# Patient Record
Sex: Male | Born: 1945 | Race: White | Hispanic: No | Marital: Single | Smoking: Never smoker
Health system: Southern US, Community
[De-identification: ages and names within clinical notes are randomized; demographics above are authoritative.]

## PROBLEM LIST (undated history)

## (undated) DIAGNOSIS — I10 Essential (primary) hypertension: Secondary | ICD-10-CM

## (undated) DIAGNOSIS — E119 Type 2 diabetes mellitus without complications: Secondary | ICD-10-CM

---

## 2014-11-01 ENCOUNTER — Encounter: Payer: Self-pay | Admitting: Hematology & Oncology

## 2014-11-11 ENCOUNTER — Telehealth: Payer: Self-pay | Admitting: Hematology & Oncology

## 2014-11-11 NOTE — Telephone Encounter (Signed)
Left vm w NEW PATIENT today to remind them of their appointment with Dr. Ennever. Also, advised them to bring all medication bottles and insurance card information. ° °

## 2014-11-14 ENCOUNTER — Ambulatory Visit: Payer: Medicare Other

## 2014-11-14 ENCOUNTER — Ambulatory Visit (HOSPITAL_BASED_OUTPATIENT_CLINIC_OR_DEPARTMENT_OTHER): Payer: Medicare Other | Admitting: Lab

## 2014-11-14 ENCOUNTER — Encounter: Payer: Self-pay | Admitting: Family

## 2014-11-14 ENCOUNTER — Ambulatory Visit (HOSPITAL_BASED_OUTPATIENT_CLINIC_OR_DEPARTMENT_OTHER): Payer: Medicare Other | Admitting: Family

## 2014-11-14 VITALS — BP 112/65 | HR 90 | Temp 97.7°F | Resp 18 | Ht 71.0 in | Wt 254.0 lb

## 2014-11-14 DIAGNOSIS — D7589 Other specified diseases of blood and blood-forming organs: Secondary | ICD-10-CM

## 2014-11-14 DIAGNOSIS — D472 Monoclonal gammopathy: Secondary | ICD-10-CM

## 2014-11-14 DIAGNOSIS — D61818 Other pancytopenia: Secondary | ICD-10-CM

## 2014-11-14 DIAGNOSIS — R161 Splenomegaly, not elsewhere classified: Secondary | ICD-10-CM

## 2014-11-14 DIAGNOSIS — E119 Type 2 diabetes mellitus without complications: Secondary | ICD-10-CM

## 2014-11-14 DIAGNOSIS — D500 Iron deficiency anemia secondary to blood loss (chronic): Secondary | ICD-10-CM

## 2014-11-14 LAB — CBC WITH DIFFERENTIAL (CANCER CENTER ONLY)
BASO#: 0 10*3/uL (ref 0.0–0.2)
BASO%: 0.3 % (ref 0.0–2.0)
EOS%: 3.8 % (ref 0.0–7.0)
Eosinophils Absolute: 0.2 10*3/uL (ref 0.0–0.5)
HCT: 38 % — ABNORMAL LOW (ref 38.7–49.9)
HGB: 13.1 g/dL (ref 13.0–17.1)
LYMPH#: 1.6 10*3/uL (ref 0.9–3.3)
LYMPH%: 39 % (ref 14.0–48.0)
MCH: 31.8 pg (ref 28.0–33.4)
MCHC: 34.5 g/dL (ref 32.0–35.9)
MCV: 92 fL (ref 82–98)
MONO#: 0.3 10*3/uL (ref 0.1–0.9)
MONO%: 6.8 % (ref 0.0–13.0)
NEUT%: 50.1 % (ref 40.0–80.0)
NEUTROS ABS: 2 10*3/uL (ref 1.5–6.5)
Platelets: 106 10*3/uL — ABNORMAL LOW (ref 145–400)
RBC: 4.12 10*6/uL — AB (ref 4.20–5.70)
RDW: 16.4 % — ABNORMAL HIGH (ref 11.1–15.7)
WBC: 4 10*3/uL (ref 4.0–10.0)

## 2014-11-14 LAB — CHCC SATELLITE - SMEAR

## 2014-11-14 NOTE — Progress Notes (Signed)
Hematology/Oncology Consultation   Name: Fernando Soto      MRN: [id number]    Location: Room/bed info not found  Date: 11/14/2014 Time:3:42 PM   REFERRING PHYSICIAN:  Nolene Bernheim. Soto   REASON FOR CONSULT:  Pancytopenia, splenomegaly, macrocytosis, monoclonal gammopathy    DIAGNOSIS:  Thrombocytopenia and splenomegaly  HISTORY OF PRESENT ILLNESS: Fernando Soto is a very pleasant 69 yo male with a recent diagnosis of pancytopenia, splenomegaly, macrocytosis, monoclonal gammopathy. He has occasional left sided pain.  He has had no problem with infections. He denies fever, chills, n/v, cough, rash, headache, dizziness, SOB, chest pain, palpitations, diarrhea, blood in urine or stool.  He has had some constipation. He was supposed to have a colonoscopy and he was not clear enough so he is in the process of rescheduling.   He does not smoke or drink alcohol.  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 He has had several toes removed, one recently. His left foot and ankle are bandaged. He has this changed every week at a wound clinic. He has chronic swelling in both legs that comes and goes. Elevation is helpful.  He has no history of bleeding or clotting. No personal or family history of cancer. His appetite is good and he is staying hydrated. His weight is stable at 254 lbs.  He is from the [facility] area and worked for year as a Fernando Soto. He then worked as an Fernando Soto for 15 years before retiring.    ROS: All other 10 point review of systems is negative.   PAST MEDICAL HISTORY:   No past medical history on file.  ALLERGIES: Allergies  Allergen Reactions  . Influenza Vaccines     BP dropped and felt very hot.      MEDICATIONS:  No current outpatient prescriptions on file prior to visit.   No current facility-administered medications on file prior to visit.     PAST SURGICAL HISTORY No past surgical history on  file.  FAMILY HISTORY: No family history on file.  SOCIAL HISTORY:  reports that he has never smoked. He has never used smokeless tobacco. His alcohol and drug histories are not on file.  PERFORMANCE STATUS: The patient's performance status is 1 - Symptomatic but completely ambulatory  PHYSICAL EXAM: Most Recent Vital Signs: Blood pressure 112/65, pulse 90, temperature 97.7 F (36.5 C), temperature source Oral, resp. rate 18, height _0  (1.803 m), weight 254 lb (115.214 kg). BP 112/65 mmHg  Pulse 90  Temp(Src) 97.7 F (36.5 C) (Oral)  Resp 18  Ht _1  (1.803 m)  Wt 254 lb (115.214 kg)  BMI 35.44 kg/m2  General Appearance:    Alert, cooperative, no distress, appears stated age  Head:    Normocephalic, without obvious abnormality, atraumatic  Eyes:    PERRL, conjunctiva/corneas clear, EOM's intact, fundi    benign, both eyes        Throat:   Lips, mucosa, and tongue normal; teeth and gums normal  Neck:   Supple, symmetrical, trachea midline, no adenopathy;    thyroid:  no enlargement/tenderness/nodules; no carotid   bruit or JVD  Back:     Symmetric, no curvature, ROM normal, no CVA tenderness  Lungs:     Clear to auscultation bilaterally, respirations unlabored  Chest Wall:    No tenderness or deformity   Heart:    Regular rate and rhythm, S1 and S2 normal, no murmur, rub  or gallop     Abdomen:     Soft, non-tender, bowel sounds active all four quadrants,    no masses, no organomegaly        Extremities:   Extremities normal, atraumatic, no cyanosis or edema  Pulses:   2+ and symmetric all extremities  Skin:   Skin color, texture, turgor normal, no rashes or lesions  Lymph nodes:   Cervical, supraclavicular, and axillary nodes normal  Neurologic:   CNII-XII intact, normal strength, sensation and reflexes    throughout   LABORATORY DATA:  Results for orders placed or performed in visit on 11/14/14 (from the past 48 hour(s))  CBC with Differential Fernando Soto  [facility]ellite)     Status: Abnormal   Collection Time: 11/14/14  1:48 PM  Result Value Ref Range   WBC 4.0 4.0 - 10.0 10e3/uL   RBC 4.12 (L) 4.20 - 5.70 10e6/uL   HGB 13.1 13.0 - 17.1 g/dL   HCT 38.0 (L) 38.7 - 49.9 %   MCV 92 82 - 98 fL   MCH 31.8 28.0 - 33.4 pg   MCHC 34.5 32.0 - 35.9 g/dL   RDW 16.4 (H) 11.1 - 15.7 %   Platelets 106 (L) 145 - 400 10e3/uL   NEUT# 2.0 1.5 - 6.5 10e3/uL   LYMPH# 1.6 0.9 - 3.3 10e3/uL   MONO# 0.3 0.1 - 0.9 10e3/uL   Eosinophils Absolute 0.2 0.0 - 0.5 10e3/uL   BASO# 0.0 0.0 - 0.2 10e3/uL   NEUT% 50.1 40.0 - 80.0 %   LYMPH% 39.0 14.0 - 48.0 %   MONO% 6.8 0.0 - 13.0 %   EOS% 3.8 0.0 - 7.0 %   BASO% 0.3 0.0 - 2.0 %  [facility] - Smear     Status: None   Collection Time: 11/14/14  1:48 PM  Result Value Ref Range   Smear Result Smear Available       RADIOGRAPHY: No results found.     PATHOLOGY: None  ASSESSMENT/PLAN: Fernando Soto is a very pleasant 69 yo male with a recent diagnosis of pancytopenia, splenomegaly, macrocytosis, monoclonal gammopathy. He has occasional left flank pain. He has had no problem with infections or bleeding.  His WBC is 4.0, Hgb 13.1, MCV 92 and platelets 106. His smear was reviewed by Fernando Soto. He will not be needing a bone marrow biopsy at this time.  We will see him back in 2 months for labs and follow-up. We will also get another abdominal ultrasound that same day.  All questions were answered. He knows to call the clinic with any problems, questions or concerns. We can certainly see him much sooner if necessary.  The patient was discussed with and also seen by Fernando Soto and he is in agreement with the aforementioned.   [facility] M    Addendum: I saw and examined the patient with Fernando Soto.  We looked at his smear. He had normochromic and normocytic red blood cells. There were no nucleated red blood cells. There were no teardrop cells. He had no schistocytes or spherocytes. White blood cells appeared  normal in morphology maturation. There were no hypersegmented polys. There were no immature myeloid or lymphoid cells. Platelets were slightly decreased in number. He had several large platelets. Platelets were well granulated.  Eyes exam, I wrote cannot palpate his spleen.  It's certainly hard to say what is going on with the splenomegaly. It's possible that he may have early NASH. Is also possible there might be some type of  infiltrative spleen issue. He may have congestive splenomegaly. He may have idiopathic splenomegaly.  I find it very interesting that he has diabetes and has only had diabetes for 4 years but yet has all these complications.  He ultimately may need to have a bone marrow biopsy done. However, I just don't think that doing one now is really going to make a difference. He is asymptomatic.  If there is a monoclonal myopathy, I would suspect that this is related to his underlying diabetes.  We spent about 45 minutes with him. We answered all his questions.  I think we probably should get him back in about 3 months. I would like to do an ultrasound when he gets back.  Laurey Arrow

## 2014-11-15 LAB — IRON AND TIBC CHCC
%SAT: 25 % (ref 20–55)
Iron: 67 ug/dL (ref 42–163)
TIBC: 265 ug/dL (ref 202–409)
UIBC: 198 ug/dL (ref 117–376)

## 2014-11-15 LAB — FERRITIN CHCC: Ferritin: 160 ng/ml (ref 22–316)

## 2014-11-16 LAB — COMPREHENSIVE METABOLIC PANEL
AST: 17 U/L (ref 0–37)
Albumin: 4.4 g/dL (ref 3.5–5.2)
Alkaline Phosphatase: 82 U/L (ref 39–117)
BILIRUBIN TOTAL: 1.1 mg/dL (ref 0.2–1.2)
BUN: 17 mg/dL (ref 6–23)
CHLORIDE: 101 meq/L (ref 96–112)
CO2: 27 meq/L (ref 19–32)
Calcium: 9.6 mg/dL (ref 8.4–10.5)
Creatinine, Ser: 1 mg/dL (ref 0.50–1.35)
GLUCOSE: 207 mg/dL — AB (ref 70–99)
POTASSIUM: 4.6 meq/L (ref 3.5–5.3)
SODIUM: 138 meq/L (ref 135–145)
Total Protein: 6.5 g/dL (ref 6.0–8.3)

## 2014-11-16 LAB — PROTEIN ELECTROPHORESIS, SERUM, WITH REFLEX
ALBUMIN ELP: 63.8 % (ref 55.8–66.1)
Alpha-1-Globulin: 4.5 % (ref 2.9–4.9)
Alpha-2-Globulin: 11.2 % (ref 7.1–11.8)
BETA 2: 5 % (ref 3.2–6.5)
BETA GLOBULIN: 5.8 % (ref 4.7–7.2)
GAMMA GLOBULIN: 9.7 % — AB (ref 11.1–18.8)
M-Spike, %: 0.2 g/dL
Total Protein, Serum Electrophoresis: 6.5 g/dL (ref 6.0–8.3)

## 2014-11-16 LAB — IFE INTERPRETATION

## 2014-11-16 LAB — IGG, IGA, IGM
IGM, SERUM: 40 mg/dL — AB (ref 41–251)
IgA: 195 mg/dL (ref 68–379)
IgG (Immunoglobin G), Serum: 609 mg/dL — ABNORMAL LOW (ref 650–1600)

## 2014-11-16 LAB — HEMOGLOBINOPATHY EVALUATION
HGB F QUANT: 0 % (ref 0.0–2.0)
Hemoglobin Other: 0 %
Hgb A2 Quant: 2.6 % (ref 2.2–3.2)
Hgb A: 97.4 % (ref 96.8–97.8)
Hgb S Quant: 0 %

## 2014-11-16 LAB — LACTATE DEHYDROGENASE: LDH: 180 U/L (ref 94–250)

## 2014-11-16 LAB — RETICULOCYTES (CHCC)
ABS Retic: 141.4 10*3/uL (ref 19.0–186.0)
RBC.: 4.16 MIL/uL — ABNORMAL LOW (ref 4.22–5.81)
RETIC CT PCT: 3.4 % — AB (ref 0.4–2.3)

## 2015-01-11 ENCOUNTER — Encounter: Payer: Self-pay | Admitting: Hematology & Oncology

## 2015-01-11 ENCOUNTER — Ambulatory Visit (HOSPITAL_BASED_OUTPATIENT_CLINIC_OR_DEPARTMENT_OTHER)
Admission: RE | Admit: 2015-01-11 | Discharge: 2015-01-11 | Disposition: A | Discharge status: Home / Self Care | Payer: Medicare Other | Source: Ambulatory Visit | Attending: Family | Admitting: Family

## 2015-01-11 ENCOUNTER — Other Ambulatory Visit (HOSPITAL_BASED_OUTPATIENT_CLINIC_OR_DEPARTMENT_OTHER): Payer: Medicare Other | Admitting: Lab

## 2015-01-11 ENCOUNTER — Ambulatory Visit (HOSPITAL_BASED_OUTPATIENT_CLINIC_OR_DEPARTMENT_OTHER): Payer: Medicare Other | Admitting: Hematology & Oncology

## 2015-01-11 VITALS — BP 136/70 | HR 87 | Temp 97.9°F | Resp 18 | Ht 69.0 in | Wt 259.0 lb

## 2015-01-11 DIAGNOSIS — D696 Thrombocytopenia, unspecified: Secondary | ICD-10-CM | POA: Diagnosis not present

## 2015-01-11 DIAGNOSIS — R161 Splenomegaly, not elsewhere classified: Secondary | ICD-10-CM

## 2015-01-11 DIAGNOSIS — G629 Polyneuropathy, unspecified: Secondary | ICD-10-CM | POA: Diagnosis not present

## 2015-01-11 DIAGNOSIS — E119 Type 2 diabetes mellitus without complications: Secondary | ICD-10-CM

## 2015-01-11 LAB — CBC WITH DIFFERENTIAL (CANCER CENTER ONLY)
BASO#: 0 10*3/uL (ref 0.0–0.2)
BASO%: 0.2 % (ref 0.0–2.0)
EOS%: 2.9 % (ref 0.0–7.0)
Eosinophils Absolute: 0.1 10*3/uL (ref 0.0–0.5)
HCT: 36.2 % — ABNORMAL LOW (ref 38.7–49.9)
HGB: 12.8 g/dL — ABNORMAL LOW (ref 13.0–17.1)
LYMPH#: 1.8 10*3/uL (ref 0.9–3.3)
LYMPH%: 44.9 % (ref 14.0–48.0)
MCH: 32.8 pg (ref 28.0–33.4)
MCHC: 35.4 g/dL (ref 32.0–35.9)
MCV: 93 fL (ref 82–98)
MONO#: 0.3 10*3/uL (ref 0.1–0.9)
MONO%: 8.3 % (ref 0.0–13.0)
NEUT%: 43.7 % (ref 40.0–80.0)
NEUTROS ABS: 1.8 10*3/uL (ref 1.5–6.5)
PLATELETS: 89 10*3/uL — AB (ref 145–400)
RBC: 3.9 10*6/uL — ABNORMAL LOW (ref 4.20–5.70)
RDW: 17.1 % — AB (ref 11.1–15.7)
WBC: 4.1 10*3/uL (ref 4.0–10.0)

## 2015-01-11 LAB — CMP (CANCER CENTER ONLY)
ALT(SGPT): 13 U/L (ref 10–47)
AST: 25 U/L (ref 11–38)
Albumin: 3.8 g/dL (ref 3.3–5.5)
Alkaline Phosphatase: 90 U/L — ABNORMAL HIGH (ref 26–84)
BUN: 20 mg/dL (ref 7–22)
CALCIUM: 9.2 mg/dL (ref 8.0–10.3)
CO2: 29 mEq/L (ref 18–33)
CREATININE: 1.2 mg/dL (ref 0.6–1.2)
Chloride: 104 mEq/L (ref 98–108)
Glucose, Bld: 164 mg/dL — ABNORMAL HIGH (ref 73–118)
Potassium: 4.3 mEq/L (ref 3.3–4.7)
SODIUM: 141 meq/L (ref 128–145)
TOTAL PROTEIN: 6.5 g/dL (ref 6.4–8.1)
Total Bilirubin: 1.4 mg/dl (ref 0.20–1.60)

## 2015-01-11 LAB — LACTATE DEHYDROGENASE: LDH: 172 U/L (ref 94–250)

## 2015-01-11 LAB — CHCC SATELLITE - SMEAR

## 2015-01-13 ENCOUNTER — Telehealth: Payer: Self-pay | Admitting: Hematology & Oncology

## 2015-01-13 NOTE — Telephone Encounter (Signed)
Left pt message to call for details of 4-8 and 4-13 appointments

## 2015-01-13 NOTE — Progress Notes (Signed)
Hematology and Oncology Follow Up Visit  Fernando Soto [id number] 1946/02/05 69 y.o. 01/13/2015   Principle Diagnosis:   Thrombocytopenia  Splenomegaly  Current Therapy:    Observation     Interim History:  Mr. Kestenbaum is back for second office visit. We first saw him back in January.  He still has problem with neuropathy. He has bad diabetes. He did have a ultrasound done today of the abdomen. This showed mild to moderate splenomegaly.  He's had no bleeding. He's had no bruising. He's had no change in medications.  We first saw him, our workup was pretty much unremarkable. He had a tiny monoclonal spike of 0.2 g/L. His IgG level was a little bit low as 609 mg/dL. He had a normal ferritin and iron saturation. His blood sugars were quite high.  I suspect that he probably has cirrhosis. He probably has NASH. I think a CT scan is going to be necessary. Medications:  Current outpatient prescriptions:  .  alfuzosin (UROXATRAL) 10 MG 24 hr tablet, Take 10 mg by mouth., Disp: , Rfl:  .  allopurinol (ZYLOPRIM) 300 MG tablet, Take 300 mg by mouth daily. Frequency:   Dosage:0.0     Instructions:  Note:, Disp: , Rfl:  .  Cholecalciferol (D 2000) 2000 UNITS TABS, Take by mouth every morning. , Disp: , Rfl:  .  clindamycin (CLEOCIN) 300 MG capsule, Take 300 mg by mouth 3 (three) times daily. , Disp: , Rfl:  .  doxepin (SINEQUAN) 50 MG capsule, Take 50 mg by mouth at bedtime as needed. Frequency:   Dosage:0.0     Instructions:  Note:, Disp: , Rfl:  .  enalapril (VASOTEC) 10 MG tablet, Take 10 mg by mouth 2 (two) times daily. Frequency:   Dosage:0.0     Instructions:  Note:, Disp: , Rfl:  .  FOLBIC 2.5-25-2 MG TABS, Take 1 tablet by mouth daily., Disp: , Rfl: 99 .  Folic Acid-Vit N1-AFB X03 (FOLBEE) 2.5-25-1 MG TABS tablet, Take 1 tablet by mouth daily. Frequency:   Dosage:0.0     Instructions:  Note:, Disp: , Rfl:  .  gabapentin (NEURONTIN) 300 MG capsule, Take 300 mg by mouth 3 (three)  times daily. , Disp: , Rfl:  .  gabapentin (NEURONTIN) 300 MG capsule, Take 300 mg by mouth 3 (three) times daily. , Disp: , Rfl: 99 .  gemfibrozil (LOPID) 600 MG tablet, Take 600 mg by mouth 2 (two) times daily before a meal. , Disp: , Rfl:  .  insulin detemir (LEVEMIR) 100 UNIT/ML injection, Inject 35 Units into the skin at bedtime. Inject unit subcutaneous as directed, Disp: , Rfl:  .  insulin NPH-regular Human (HUMULIN 70/30) (70-30) 100 UNIT/ML injection, Inject 25 Units into the skin daily with breakfast. Frequency:   Dosage:0.0     Instructions:  Note:, Disp: , Rfl:  .  L-Methylfolate-B6-B12 (FOLTX) 1.13-25-2 MG TABS, Take by mouth., Disp: , Rfl:  .  metoprolol tartrate (LOPRESSOR) 25 MG tablet, Take 12.5 mg by mouth 2 (two) times daily. , Disp: , Rfl:  .  Multiple Vitamin (MULTIVITAMIN) capsule, Take by mouth daily. , Disp: , Rfl:  .  Omega-3 1000 MG CAPS, Take by mouth every morning. , Disp: , Rfl:  .  omeprazole (PRILOSEC) 40 MG capsule, Frequency:   Dosage:0.0     Instructions:  Note:, Disp: , Rfl:  .  rivaroxaban (XARELTO) 20 MG TABS tablet, Take by mouth daily with supper. PT STATES HE TAKES .5 MG DAILY, Disp: ,  Rfl:  .  temazepam (RESTORIL) 30 MG capsule, Take 30 mg by mouth at bedtime as needed. Frequency:   Dosage:0.0     Instructions:  Note:, Disp: , Rfl:  .  traMADol (ULTRAM) 50 MG tablet, Take 50 mg by mouth every 6 (six) hours as needed. for pain, Disp: , Rfl: 0 .  HYDROcodone-acetaminophen (NORCO) 10-325 MG per tablet, Take 1 tablet by mouth every 6 (six) hours as needed. , Disp: , Rfl:   Allergies:  Allergies  Allergen Reactions  . Influenza Vaccines     BP dropped and felt very hot.    Past Medical History, Surgical history, Social history, and Family History were reviewed and updated.  Review of Systems: As above  Physical Exam:  height is $RemoveB'5\' 9"'McjJnBlo$  (1.753 m) and weight is 259 lb (117.482 kg). His oral temperature is 97.9 F (36.6 C). His blood pressure is 136/70 and  his pulse is 87. His respiration is 18.   Wt Readings from Last 3 Encounters:  01/11/15 259 lb (117.482 kg)  11/14/14 254 lb (115.214 kg)     Somewhat obese white gentleman in no obvious distress. Head and neck exam shows no ocular or oral lesions. He has no palpable cervical or supraclavicular lymph nodes. Lungs are clear. Cardiac exam regular rate and rhythm with no murmurs, rubs or bruits. Abdomen is obese but soft. He has good bowel sounds. There is no fluid wave. There is no palpable liver or spleen tip. Back exam shows no tenderness over the spine, ribs or hips. Extremities shows no clubbing, cyanosis or edema. He does have some chronic nonpitting edema of the lower legs. He has some stasis dermatitis changes in the lower legs. Neurological exam shows some hypersensitivity to touch in the feet and ankles.  Lab Results  Component Value Date   WBC 4.1 01/11/2015   HGB 12.8* 01/11/2015   HCT 36.2* 01/11/2015   MCV 93 01/11/2015   PLT 89* 01/11/2015     Chemistry      Component Value Date/Time   NA 141 01/11/2015 0845   NA 138 11/14/2014 1348   K 4.3 01/11/2015 0845   K 4.6 11/14/2014 1348   CL 104 01/11/2015 0845   CL 101 11/14/2014 1348   CO2 29 01/11/2015 0845   CO2 27 11/14/2014 1348   BUN 20 01/11/2015 0845   BUN 17 11/14/2014 1348   CREATININE 1.2 01/11/2015 0845   CREATININE 1.00 11/14/2014 1348      Component Value Date/Time   CALCIUM 9.2 01/11/2015 0845   CALCIUM 9.6 11/14/2014 1348   ALKPHOS 90* 01/11/2015 0845   ALKPHOS 82 11/14/2014 1348   AST 25 01/11/2015 0845   AST 17 11/14/2014 1348   ALT 13 01/11/2015 0845   ALT <8 11/14/2014 1348   BILITOT 1.40 01/11/2015 0845   BILITOT 1.1 11/14/2014 1348         Impression and Plan: Mr. Cure is 69 year old gentleman. He has splenomegaly. He has thrombocytopenia.  Again, I have to suspect that he is going to have cirrhosis. A CT scan can help [place] with this.  I would still like to hold off on a bone  marrow biopsy on him. However, if his platelet count continues to drop, I don't think we have any other option but to do a bone marrow biopsy.  I spent about 30 minutes with him. I went over his labs. I explained to him what I thought was going on and why we had to  do a CT scan. He understands this and agrees.  I want to get him back in about 4 weeks or so.   Volanda Napoleon, MD 3/11/20161:49 PM

## 2015-02-02 ENCOUNTER — Telehealth: Payer: Self-pay | Admitting: Hematology & Oncology

## 2015-02-02 NOTE — Telephone Encounter (Signed)
Pt called got CT last week for kidney stones has disc. I transferred to RN line so they could check and see if he needed the one scheduled for 4-8

## 2015-02-09 ENCOUNTER — Telehealth: Payer: Self-pay | Admitting: Hematology & Oncology

## 2015-02-09 NOTE — Telephone Encounter (Signed)
Pilar Plate left message wanting to cx CT. I called him back and the CT was for next week, he said he moved it this morning. He finally said he wanted to move the MD appointment also because pt was in the hospital. That has been moved to 4-22

## 2015-02-10 ENCOUNTER — Ambulatory Visit (HOSPITAL_BASED_OUTPATIENT_CLINIC_OR_DEPARTMENT_OTHER): Payer: Medicare Other

## 2015-02-15 ENCOUNTER — Telehealth: Payer: Self-pay | Admitting: Hematology & Oncology

## 2015-02-15 ENCOUNTER — Other Ambulatory Visit: Payer: Medicare Other

## 2015-02-15 ENCOUNTER — Ambulatory Visit: Payer: Medicare Other | Admitting: Hematology & Oncology

## 2015-02-15 NOTE — Telephone Encounter (Signed)
PT CALLED HAD QUESTION ABOT ct AND INSURANCE, i TRANSFERRED TO tARA

## 2015-02-17 ENCOUNTER — Ambulatory Visit (HOSPITAL_BASED_OUTPATIENT_CLINIC_OR_DEPARTMENT_OTHER)
Admission: RE | Admit: 2015-02-17 | Discharge: 2015-02-17 | Disposition: A | Discharge status: Home / Self Care | Payer: Medicare Other | Source: Ambulatory Visit | Attending: Hematology & Oncology | Admitting: Hematology & Oncology

## 2015-02-17 DIAGNOSIS — R161 Splenomegaly, not elsewhere classified: Secondary | ICD-10-CM | POA: Insufficient documentation

## 2015-02-17 DIAGNOSIS — I700 Atherosclerosis of aorta: Secondary | ICD-10-CM | POA: Insufficient documentation

## 2015-02-17 DIAGNOSIS — N200 Calculus of kidney: Secondary | ICD-10-CM | POA: Diagnosis not present

## 2015-02-17 DIAGNOSIS — D696 Thrombocytopenia, unspecified: Secondary | ICD-10-CM | POA: Insufficient documentation

## 2015-02-17 DIAGNOSIS — K7460 Unspecified cirrhosis of liver: Secondary | ICD-10-CM | POA: Insufficient documentation

## 2015-02-17 MED ORDER — IOHEXOL 300 MG/ML  SOLN
100.0000 mL | Freq: Once | INTRAMUSCULAR | Status: AC | PRN
  Administered 2015-02-17: 100 mL via INTRAVENOUS

## 2015-02-20 ENCOUNTER — Telehealth: Payer: Self-pay | Admitting: *Deleted

## 2015-02-20 ENCOUNTER — Telehealth: Payer: Self-pay | Admitting: Emergency Medicine

## 2015-02-20 NOTE — Telephone Encounter (Addendum)
Patient aware of results.  ----- Message from Volanda Napoleon, MD sent at 02/20/2015  7:17 AM EDT ----- Call - everything looks stable!! Spleen is not any larger.  pete

## 2015-02-20 NOTE — Telephone Encounter (Signed)
Patient called to inquire if he needed to keep his appointment on 4/22 for labs and Dr Marin Olp; patient instructed to keep appointment so that he and Dr Marin Olp can further discuss treatment plan. Patient verbalized understanding.

## 2015-02-24 ENCOUNTER — Encounter: Payer: Self-pay | Admitting: Hematology & Oncology

## 2015-02-24 ENCOUNTER — Ambulatory Visit (HOSPITAL_BASED_OUTPATIENT_CLINIC_OR_DEPARTMENT_OTHER): Payer: Medicare Other | Admitting: Hematology & Oncology

## 2015-02-24 ENCOUNTER — Ambulatory Visit (HOSPITAL_BASED_OUTPATIENT_CLINIC_OR_DEPARTMENT_OTHER): Payer: Medicare Other

## 2015-02-24 VITALS — BP 119/67 | HR 79 | Temp 97.4°F | Resp 18 | Ht 69.0 in | Wt 249.0 lb

## 2015-02-24 DIAGNOSIS — D696 Thrombocytopenia, unspecified: Secondary | ICD-10-CM | POA: Diagnosis not present

## 2015-02-24 DIAGNOSIS — R161 Splenomegaly, not elsewhere classified: Secondary | ICD-10-CM | POA: Diagnosis present

## 2015-02-24 LAB — CBC WITH DIFFERENTIAL (CANCER CENTER ONLY)
BASO#: 0 10*3/uL (ref 0.0–0.2)
BASO%: 0.2 % (ref 0.0–2.0)
EOS%: 2.1 % (ref 0.0–7.0)
Eosinophils Absolute: 0.1 10*3/uL (ref 0.0–0.5)
HCT: 38 % — ABNORMAL LOW (ref 38.7–49.9)
HGB: 13.6 g/dL (ref 13.0–17.1)
LYMPH#: 1.3 10*3/uL (ref 0.9–3.3)
LYMPH%: 30.3 % (ref 14.0–48.0)
MCH: 33.2 pg (ref 28.0–33.4)
MCHC: 35.8 g/dL (ref 32.0–35.9)
MCV: 93 fL (ref 82–98)
MONO#: 0.3 10*3/uL (ref 0.1–0.9)
MONO%: 5.9 % (ref 0.0–13.0)
NEUT#: 2.7 10*3/uL (ref 1.5–6.5)
NEUT%: 61.5 % (ref 40.0–80.0)
Platelets: 90 10*3/uL — ABNORMAL LOW (ref 145–400)
RBC: 4.1 10*6/uL — ABNORMAL LOW (ref 4.20–5.70)
RDW: 14.4 % (ref 11.1–15.7)
WBC: 4.4 10*3/uL (ref 4.0–10.0)

## 2015-02-24 LAB — LACTATE DEHYDROGENASE: LDH: 168 U/L (ref 94–250)

## 2015-02-24 NOTE — Progress Notes (Signed)
Hematology and Oncology Follow Up Visit  Fernando Soto [id number] June 04, 1946 69 y.o. 02/24/2015   Principle Diagnosis:   Thrombocytopenia  Splenomegaly  Current Therapy:    Observation     Interim History:  Fernando Soto is back for follow-up. 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  We did go ahead and have a CT scan done of his abdomen. He has moderate splenomegaly. There is no cirrhosis. There is no adenopathy.  He's lost some more weight. His blood sugars have been on the high side.  He's had no issues with alcohol use. He does not smoke.  He's had no fever. He's had no bleeding.   He has had some more pain in the left upper quadrant of his abdomen. I suspect this might be from his spleen.   Medications:  Current outpatient prescriptions:  .  allopurinol (ZYLOPRIM) 300 MG tablet, Take 300 mg by mouth daily. Frequency:   Dosage:0.0     Instructions:  Note:, Disp: , Rfl:  .  Cholecalciferol (D 2000) 2000 UNITS TABS, Take by mouth every morning. , Disp: , Rfl:  .  doxepin (SINEQUAN) 50 MG capsule, Take 50 mg by mouth at bedtime as needed. Frequency:   Dosage:0.0     Instructions:  Note:, Disp: , Rfl:  .  enalapril (VASOTEC) 10 MG tablet, Take 10 mg by mouth 2 (two) times daily. Frequency:   Dosage:0.0     Instructions:  Note:, Disp: , Rfl:  .  FOLBIC 2.5-25-2 MG TABS, Take 1 tablet by mouth daily., Disp: , Rfl: 99 .  Folic Acid-Vit H8-ION G29 (FOLBEE) 2.5-25-1 MG TABS tablet, Take 1 tablet by mouth daily. Frequency:   Dosage:0.0     Instructions:  Note:, Disp: , Rfl:  .  gabapentin (NEURONTIN) 300 MG capsule, Take 300 mg by mouth 3 (three) times daily. , Disp: , Rfl:  .  gemfibrozil (LOPID) 600 MG tablet, Take 600 mg by mouth 2 (two) times daily before a meal. , Disp: , Rfl:  .  insulin detemir (LEVEMIR) 100 UNIT/ML injection, Inject 35 Units into the skin at bedtime. Inject unit subcutaneous as directed, Disp: , Rfl:    .  insulin NPH-regular Human (HUMULIN 70/30) (70-30) 100 UNIT/ML injection, Inject 25 Units into the skin daily with breakfast. Frequency:   Dosage:0.0     Instructions:  Note:, Disp: , Rfl:  .  L-Methylfolate-B6-B12 (FOLTX) 1.13-25-2 MG TABS, Take by mouth every morning. , Disp: , Rfl:  .  metoprolol tartrate (LOPRESSOR) 25 MG tablet, Take 12.5 mg by mouth 2 (two) times daily. , Disp: , Rfl:  .  Multiple Vitamin (MULTIVITAMIN) capsule, Take by mouth daily. , Disp: , Rfl:  .  Omega-3 1000 MG CAPS, Take by mouth every morning. , Disp: , Rfl:  .  rivaroxaban (XARELTO) 20 MG TABS tablet, Take by mouth daily with supper. PT STATES HE TAKES .5 MG DAILY, Disp: , Rfl:  .  temazepam (RESTORIL) 30 MG capsule, Take 30 mg by mouth at bedtime as needed. Frequency:   Dosage:0.0     Instructions:  Note:, Disp: , Rfl:  .  traMADol (ULTRAM) 50 MG tablet, Take 50 mg by mouth every 6 (six) hours as needed. for pain, Disp: , Rfl: 0 .  alfuzosin (UROXATRAL) 10 MG 24 hr tablet, Take 10 mg by mouth., Disp: , Rfl:  .  HYDROcodone-acetaminophen (NORCO) 10-325 MG per tablet, Take 1 tablet by mouth every 6 (six) hours  as needed. , Disp: , Rfl:  .  omeprazole (PRILOSEC) 40 MG capsule, Frequency:   Dosage:0.0     Instructions:  Note:, Disp: , Rfl:   Allergies:  Allergies  Allergen Reactions  . Influenza Vaccines     BP dropped and felt very hot.    Past Medical History, Surgical history, Social history, and Family History were reviewed and updated.  Review of Systems: As above  Physical Exam:  height is 5\' 9"  (1.753 m) and weight is 249 lb (112.946 kg). His oral temperature is 97.4 F (36.3 C). His blood pressure is 119/67 and his pulse is 79. His respiration is 18.   Wt Readings from Last 3 Encounters:  02/24/15 249 lb (112.946 kg)  01/11/15 259 lb (117.482 kg)  11/14/14 254 lb (115.214 kg)     Somewhat obese white gentleman in no obvious distress. Head and neck exam shows no ocular or oral lesions. He has  no palpable cervical or supraclavicular lymph nodes. Lungs are clear. Cardiac exam regular rate and rhythm with no murmurs, rubs or bruits. Abdomen is obese but soft. He has good bowel sounds. There is no fluid wave. There is no palpable liver edge. His spleen tip might be palpable at the left costal margin.. Back exam shows no tenderness over the spine, ribs or hips. Extremities shows no clubbing, cyanosis or edema. He has a foot splint on his left foot. He does have some chronic nonpitting edema of the lower legs. He has some stasis dermatitis changes in the lower legs. Neurological exam shows some hypersensitivity to touch in the feet and ankles.  Lab Results  Component Value Date   WBC 4.4 02/24/2015   HGB 13.6 02/24/2015   HCT 38.0* 02/24/2015   MCV 93 02/24/2015   PLT 90* 02/24/2015     Chemistry      Component Value Date/Time   NA 141 01/11/2015 0845   NA 138 11/14/2014 1348   K 4.3 01/11/2015 0845   K 4.6 11/14/2014 1348   CL 104 01/11/2015 0845   CL 101 11/14/2014 1348   CO2 29 01/11/2015 0845   CO2 27 11/14/2014 1348   BUN 20 01/11/2015 0845   BUN 17 11/14/2014 1348   CREATININE 1.2 01/11/2015 0845   CREATININE 1.00 11/14/2014 1348      Component Value Date/Time   CALCIUM 9.2 01/11/2015 0845   CALCIUM 9.6 11/14/2014 1348   ALKPHOS 90* 01/11/2015 0845   ALKPHOS 82 11/14/2014 1348   AST 25 01/11/2015 0845   AST 17 11/14/2014 1348   ALT 13 01/11/2015 0845   ALT <8 11/14/2014 1348   BILITOT 1.40 01/11/2015 0845   BILITOT 1.1 11/14/2014 1348         Impression and Plan: Fernando Soto is 69 year old gentleman. He has splenomegaly. He has thrombocytopenia.  He is looking pretty steady. His blood counts are stable. I looked at his blood smear. I do not see anything that looked unusual.  I think we probably get him back in 3 months. I want to do a ultrasound we see him back. This will give me an idea as to his splenomegaly.  It is possible that he may have splenic  lymphoma.  Ultimately, if the pain worsens, he may need to have his spleen taken out. I talked him about this.  I spent about 30 minutes with him.Fernando Napoleon, MD 4/22/201611:23 AM

## 2015-02-28 LAB — COMPREHENSIVE METABOLIC PANEL
ALBUMIN: 4.3 g/dL (ref 3.5–5.2)
ALK PHOS: 85 U/L (ref 39–117)
ALT: <8 U/L (ref 0–53)
AST: 16 U/L (ref 0–37)
BUN: 19 mg/dL (ref 6–23)
CO2: 25 mEq/L (ref 19–32)
Calcium: 9.2 mg/dL (ref 8.4–10.5)
Chloride: 102 mEq/L (ref 96–112)
Creatinine, Ser: 1.02 mg/dL (ref 0.50–1.35)
GLUCOSE: 172 mg/dL — AB (ref 70–99)
POTASSIUM: 4.1 meq/L (ref 3.5–5.3)
Sodium: 137 mEq/L (ref 135–145)
Total Bilirubin: 1.2 mg/dL (ref 0.2–1.2)
Total Protein: 6.3 g/dL (ref 6.0–8.3)

## 2015-02-28 LAB — KAPPA/LAMBDA LIGHT CHAINS
Kappa free light chain: 2.27 mg/dL — ABNORMAL HIGH (ref 0.33–1.94)
Kappa:Lambda Ratio: 1.45 (ref 0.26–1.65)
Lambda Free Lght Chn: 1.57 mg/dL (ref 0.57–2.63)

## 2015-02-28 LAB — LUPUS ANTICOAGULANT PANEL
DRVVT 1:1 Mix: 44.4 secs — ABNORMAL HIGH (ref <42.9)
DRVVT: 58.9 secs — ABNORMAL HIGH (ref <42.9)
Drvvt confirmation: 1.14 Ratio (ref <1.15)
Lupus Anticoagulant: NOT DETECTED
PTT LA: 41.4 s (ref 28.0–43.0)

## 2015-02-28 LAB — PROTEIN ELECTROPHORESIS, SERUM, WITH REFLEX
ALBUMIN ELP: 4.1 g/dL (ref 3.8–4.8)
ALPHA-1-GLOBULIN: 0.3 g/dL (ref 0.2–0.3)
ALPHA-2-GLOBULIN: 0.7 g/dL (ref 0.5–0.9)
Abnormal Protein Band1: 0.2 g/dL
BETA 2: 0.3 g/dL (ref 0.2–0.5)
Beta Globulin: 0.4 g/dL (ref 0.4–0.6)
GAMMA GLOBULIN: 0.5 g/dL — AB (ref 0.8–1.7)
Total Protein, Serum Electrophoresis: 6.3 g/dL (ref 6.1–8.1)

## 2015-02-28 LAB — IGG, IGA, IGM
IgA: 171 mg/dL (ref 68–379)
IgG (Immunoglobin G), Serum: 549 mg/dL — ABNORMAL LOW (ref 650–1600)
IgM, Serum: 32 mg/dL — ABNORMAL LOW (ref 41–251)

## 2015-02-28 LAB — CARDIOLIPIN ANTIBODIES, IGG, IGM, IGA
ANTICARDIOLIPIN IGG: 5 GPL U/mL (ref <23)
ANTICARDIOLIPIN IGM: 1 [MPL'U]/mL (ref <11)
Anticardiolipin IgA: 3 APL U/mL (ref <22)

## 2015-02-28 LAB — IFE INTERPRETATION

## 2015-04-05 ENCOUNTER — Telehealth: Payer: Self-pay | Admitting: *Deleted

## 2015-04-24 NOTE — Telephone Encounter (Signed)
error 

## 2015-05-25 ENCOUNTER — Other Ambulatory Visit: Payer: Self-pay | Admitting: *Deleted

## 2015-05-25 DIAGNOSIS — R161 Splenomegaly, not elsewhere classified: Secondary | ICD-10-CM

## 2015-05-26 ENCOUNTER — Ambulatory Visit (HOSPITAL_BASED_OUTPATIENT_CLINIC_OR_DEPARTMENT_OTHER): Admission: RE | Admit: 2015-05-26 | Payer: Medicare Other | Source: Ambulatory Visit

## 2015-05-26 ENCOUNTER — Other Ambulatory Visit: Payer: Medicare Other

## 2015-05-26 ENCOUNTER — Telehealth: Payer: Self-pay | Admitting: Hematology & Oncology

## 2015-05-26 ENCOUNTER — Other Ambulatory Visit (HOSPITAL_BASED_OUTPATIENT_CLINIC_OR_DEPARTMENT_OTHER): Payer: Medicare Other

## 2015-05-26 ENCOUNTER — Ambulatory Visit: Payer: Medicare Other | Admitting: Hematology & Oncology

## 2015-05-26 NOTE — Telephone Encounter (Signed)
Returned pt called and based on the symptoms describe I encouraged pt to come in to be evaluated but pt repeatedly refused. Requested to be rescheduled for Monday.  Scheduled pt for 7/25 for [place], Labs, Dr. appt.

## 2015-05-29 ENCOUNTER — Ambulatory Visit (HOSPITAL_BASED_OUTPATIENT_CLINIC_OR_DEPARTMENT_OTHER): Payer: Medicare Other | Admitting: Family

## 2015-05-29 ENCOUNTER — Other Ambulatory Visit (HOSPITAL_BASED_OUTPATIENT_CLINIC_OR_DEPARTMENT_OTHER): Payer: Medicare Other

## 2015-05-29 ENCOUNTER — Encounter: Payer: Self-pay | Admitting: Family

## 2015-05-29 ENCOUNTER — Ambulatory Visit (HOSPITAL_BASED_OUTPATIENT_CLINIC_OR_DEPARTMENT_OTHER)
Admission: RE | Admit: 2015-05-29 | Discharge: 2015-05-29 | Disposition: A | Discharge status: Home / Self Care | Payer: Medicare Other | Source: Ambulatory Visit | Attending: Hematology & Oncology | Admitting: Hematology & Oncology

## 2015-05-29 VITALS — BP 119/58 | HR 78 | Temp 97.9°F | Resp 20 | Ht 69.0 in | Wt 250.0 lb

## 2015-05-29 DIAGNOSIS — R161 Splenomegaly, not elsewhere classified: Secondary | ICD-10-CM

## 2015-05-29 DIAGNOSIS — D472 Monoclonal gammopathy: Secondary | ICD-10-CM | POA: Diagnosis not present

## 2015-05-29 DIAGNOSIS — R1012 Left upper quadrant pain: Secondary | ICD-10-CM | POA: Insufficient documentation

## 2015-05-29 LAB — CBC WITH DIFFERENTIAL (CANCER CENTER ONLY)
BASO#: 0 10*3/uL (ref 0.0–0.2)
BASO%: 0.2 % (ref 0.0–2.0)
EOS%: 1.6 % (ref 0.0–7.0)
Eosinophils Absolute: 0.1 10*3/uL (ref 0.0–0.5)
HCT: 38.7 % (ref 38.7–49.9)
HEMOGLOBIN: 13.9 g/dL (ref 13.0–17.1)
LYMPH#: 2.6 10*3/uL (ref 0.9–3.3)
LYMPH%: 47.3 % (ref 14.0–48.0)
MCH: 33.4 pg (ref 28.0–33.4)
MCHC: 35.9 g/dL (ref 32.0–35.9)
MCV: 93 fL (ref 82–98)
MONO#: 0.4 10*3/uL (ref 0.1–0.9)
MONO%: 6.6 % (ref 0.0–13.0)
NEUT%: 44.3 % (ref 40.0–80.0)
NEUTROS ABS: 2.5 10*3/uL (ref 1.5–6.5)
PLATELETS: 87 10*3/uL — AB (ref 145–400)
RBC: 4.16 10*6/uL — ABNORMAL LOW (ref 4.20–5.70)
RDW: 15.1 % (ref 11.1–15.7)
WBC: 5.6 10*3/uL (ref 4.0–10.0)

## 2015-05-29 NOTE — Progress Notes (Signed)
Hematology and Oncology Follow Up Visit  Fernando Soto [id number] [birth year] 69 y.o. 05/29/2015   Principle Diagnosis:  Thrombocytopenia Splenomegaly  Current Therapy:   Observation    Interim History:  Fernando Soto is here today for a follow-up. He is having a significant left sided pain. He states that it is becoming "unbearable." His [place] today showed mild to moderate splenomegaly, measuring 15.4 cm in length, and is stable vs. mildly decreased.  His platelet count today is 87. He has had no bruising or episodes of bleeding.  He also having nausea with out vomiting and states that he spends most of his time in bed. He has had some constipation at times.  He denies fever, chills, cough, rash, dizziness, headaches, vision changes, SOB, chest pain, palpitations, diarrhea, changes in bladder habits, blood in urine or stool.  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 He takes Neurontin for this. There has been no swelling or tenderness in his extremities.  No lymphadenopathy found on exam.  He is able to eat despite the nausea and is staying hydrated. His weight is stable. No significant loss or gain.    Medications:    Medication List       This list is accurate as of: 05/29/15  9:38 AM.  Always use your most recent med list.               allopurinol 300 MG tablet  Commonly known as:  ZYLOPRIM  Take 300 mg by mouth daily. Frequency:   Dosage:0.0     Instructions:  Note:     D 2000 2000 UNITS Tabs  Generic drug:  Cholecalciferol  Take by mouth every morning.     doxepin 50 MG capsule  Commonly known as:  SINEQUAN  Take 50 mg by mouth at bedtime as needed. Frequency:   Dosage:0.0     Instructions:  Note:     FOLBEE 2.5-25-1 MG Tabs tablet  Generic drug:  Folic Acid-Vit R4-WNI O27  Take 1 tablet by mouth daily. Frequency:   Dosage:0.0     Instructions:  Note:     FOLBIC 2.5-25-2 MG Tabs  Generic drug:  folic acid-pyridoxine-cyancobalamin  Take  1 tablet by mouth daily.     FOLTX 1.13-25-2 MG Tabs  Take by mouth every morning.     gabapentin 300 MG capsule  Commonly known as:  NEURONTIN  Take 300 mg by mouth 3 (three) times daily.     gemfibrozil 600 MG tablet  Commonly known as:  LOPID  Take 600 mg by mouth 2 (two) times daily before a meal.     HUMULIN 70/30 (70-30) 100 UNIT/ML injection  Generic drug:  insulin NPH-regular Human  Inject 25 Units into the skin daily with breakfast. Frequency:   Dosage:0.0     Instructions:  Note:     HYDROcodone-acetaminophen 10-325 MG per tablet  Commonly known as:  NORCO  Take 1 tablet by mouth every 6 (six) hours as needed.     LEVEMIR 100 UNIT/ML injection  Generic drug:  insulin detemir  Inject 35 Units into the skin at bedtime. Inject unit subcutaneous as directed     metoprolol tartrate 25 MG tablet  Commonly known as:  LOPRESSOR  Take 12.5 mg by mouth 2 (two) times daily.     multivitamin capsule  Take by mouth daily.     Omega-3 1000 MG Caps  Take by mouth every morning.     omeprazole  40 MG capsule  Commonly known as:  PRILOSEC  Frequency:   Dosage:0.0     Instructions:  Note:     temazepam 30 MG capsule  Commonly known as:  RESTORIL  Take 30 mg by mouth at bedtime as needed. Frequency:   Dosage:0.0     Instructions:  Note:     traMADol 50 MG tablet  Commonly known as:  ULTRAM  Take 50 mg by mouth every 6 (six) hours as needed. for pain     UROXATRAL 10 MG 24 hr tablet  Generic drug:  alfuzosin  Take 10 mg by mouth.     VASOTEC 10 MG tablet  Generic drug:  enalapril  Take 10 mg by mouth 2 (two) times daily. Frequency:   Dosage:0.0     Instructions:  Note:     XARELTO 20 MG Tabs tablet  Generic drug:  rivaroxaban  Take by mouth daily with supper. PT STATES HE TAKES .5 MG DAILY        Allergies:  Allergies  Allergen Reactions  . Influenza Vaccines     BP dropped and felt very hot.    Past Medical History, Surgical history, Social history, and  Family History were reviewed and updated.  Review of Systems: All other 10 point review of systems is negative.   Physical Exam:  height is 5\' 9"  (1.753 m) and weight is 250 lb (113.399 kg). His oral temperature is 97.9 F (36.6 C). His blood pressure is 119/58 and his pulse is 78. His respiration is 20.   Wt Readings from Last 3 Encounters:  05/29/15 250 lb (113.399 kg)  02/24/15 249 lb (112.946 kg)  01/11/15 259 lb (117.482 kg)    Ocular: Sclerae unicteric, pupils equal, round and reactive to light Ear-nose-throat: Oropharynx clear, dentition fair Lymphatic: No cervical or supraclavicular adenopathy Lungs no rales or rhonchi, good excursion bilaterally Heart regular rate and rhythm, no murmur appreciated Abd soft, nontender, positive bowel sounds MSK no focal spinal tenderness, no joint edema Neuro: non-focal, well-oriented, appropriate affect Breasts: Deferred  Lab Results  Component Value Date   WBC 5.6 05/29/2015   HGB 13.9 05/29/2015   HCT 38.7 05/29/2015   MCV 93 05/29/2015   PLT 87* 05/29/2015   Lab Results  Component Value Date   FERRITIN 160 11/14/2014   IRON 67 11/14/2014   TIBC 265 11/14/2014   UIBC 198 11/14/2014   IRONPCTSAT 25 11/14/2014   Lab Results  Component Value Date   RETICCTPCT 3.4* 11/14/2014   RBC 4.16* 05/29/2015   RETICCTABS 141.4 11/14/2014   Lab Results  Component Value Date   KPAFRELGTCHN 2.27* 02/24/2015   LAMBDASER 1.57 02/24/2015   KAPLAMBRATIO 1.45 02/24/2015   Lab Results  Component Value Date   IGGSERUM 549* 02/24/2015   IGA 171 02/24/2015   IGMSERUM 32* 02/24/2015   Lab Results  Component Value Date   TOTALPROTELP 6.3 02/24/2015   ALBUMINELP 4.1 02/24/2015   A1GS 0.3 02/24/2015   A2GS 0.7 02/24/2015   BETS 0.4 02/24/2015   BETA2SER 0.3 02/24/2015   GAMS 0.5* 02/24/2015   MSPIKE 0.20 11/14/2014   SPEI * 02/24/2015     Chemistry      Component Value Date/Time   NA 137 02/24/2015 0951   NA 141 01/11/2015 0845    K 4.1 02/24/2015 0951   K 4.3 01/11/2015 0845   CL 102 02/24/2015 0951   CL 104 01/11/2015 0845   CO2 25 02/24/2015 0951   CO2 29 01/11/2015  0845   BUN 19 02/24/2015 0951   BUN 20 01/11/2015 0845   CREATININE 1.02 02/24/2015 0951   CREATININE 1.2 01/11/2015 0845      Component Value Date/Time   CALCIUM 9.2 02/24/2015 0951   CALCIUM 9.2 01/11/2015 0845   ALKPHOS 85 02/24/2015 0951   ALKPHOS 90* 01/11/2015 0845   AST 16 02/24/2015 0951   AST 25 01/11/2015 0845   ALT <8 02/24/2015 0951   ALT 13 01/11/2015 0845   BILITOT 1.2 02/24/2015 0951   BILITOT 1.40 01/11/2015 0845     Impression and Plan: Fernando Soto is 69 yo gentleman with splenomegaly and thrombocytopenia. He is c/o pain in the left side that is almost "unbearable."  [place] today showed mild to moderate splenomegaly stable vs. Mildly decreased.  His platelet count is 87. He has had no episodes of bleeding.  We will go ahead and consult surgery for splenectomy. The patient would prefer this to be done at [facility] by dr. Ronny Bacon. This will be fine. I spoke with Dr. Marin Olp regarding this and he plans to call and speak with Dr. Lutricia Feil himself.  We will plan to see him back in 3 months for labs and follow-up. By that time he should have had surgery.  He knows to call here with any questions or concerns. We can certainly see him sooner if need be.   Eliezer Bottom, NP 7/25/20169:38 AM

## 2015-05-31 LAB — SPEP & IFE WITH QIG
ABNORMAL PROTEIN BAND1: 0.2 g/dL
ALPHA-1-GLOBULIN: 0.3 g/dL (ref 0.2–0.3)
Albumin ELP: 4.4 g/dL (ref 3.8–4.8)
Alpha-2-Globulin: 0.8 g/dL (ref 0.5–0.9)
Beta 2: 0.3 g/dL (ref 0.2–0.5)
Beta Globulin: 0.4 g/dL (ref 0.4–0.6)
GAMMA GLOBULIN: 0.5 g/dL — AB (ref 0.8–1.7)
IgA: 182 mg/dL (ref 68–379)
IgG (Immunoglobin G), Serum: 599 mg/dL — ABNORMAL LOW (ref 650–1600)
IgM, Serum: 31 mg/dL — ABNORMAL LOW (ref 41–251)
Total Protein, Serum Electrophoresis: 6.7 g/dL (ref 6.1–8.1)

## 2015-05-31 LAB — COMPREHENSIVE METABOLIC PANEL
ALT: 6 U/L — AB (ref 9–46)
AST: 18 U/L (ref 10–35)
Albumin: 4.9 g/dL (ref 3.6–5.1)
Alkaline Phosphatase: 110 U/L (ref 40–115)
BUN: 24 mg/dL (ref 7–25)
CALCIUM: 9.8 mg/dL (ref 8.6–10.3)
CHLORIDE: 102 mmol/L (ref 98–110)
CO2: 26 mmol/L (ref 20–31)
Creatinine, Ser: 1.07 mg/dL (ref 0.70–1.25)
GLUCOSE: 53 mg/dL — AB (ref 65–99)
Potassium: 4.1 mmol/L (ref 3.5–5.3)
Sodium: 143 mmol/L (ref 135–146)
TOTAL PROTEIN: 6.7 g/dL (ref 6.1–8.1)
Total Bilirubin: 1.2 mg/dL (ref 0.2–1.2)

## 2015-05-31 LAB — LACTATE DEHYDROGENASE: LDH: 183 U/L (ref 94–250)

## 2015-05-31 LAB — KAPPA/LAMBDA LIGHT CHAINS
KAPPA FREE LGHT CHN: 2.68 mg/dL — AB (ref 0.33–1.94)
Kappa:Lambda Ratio: 1.69 — ABNORMAL HIGH (ref 0.26–1.65)
LAMBDA FREE LGHT CHN: 1.59 mg/dL (ref 0.57–2.63)

## 2015-06-05 ENCOUNTER — Telehealth: Payer: Self-pay | Admitting: Hematology & Oncology

## 2015-06-05 NOTE — Telephone Encounter (Signed)
Returned pt's call and informed pt to wait from a call from the surgeon office regarding an appt.

## 2015-06-07 ENCOUNTER — Other Ambulatory Visit: Payer: Self-pay | Admitting: *Deleted

## 2015-06-07 DIAGNOSIS — R161 Splenomegaly, not elsewhere classified: Secondary | ICD-10-CM

## 2015-06-12 ENCOUNTER — Telehealth: Payer: Self-pay | Admitting: Hematology & Oncology

## 2015-06-12 NOTE — Telephone Encounter (Signed)
Spoke with Dr. Marjory Sneddon) regarding referring pt for a splenomegaly evauation, faxed notes, labs, and [place]. Called pt regarding the referral.

## 2015-08-28 ENCOUNTER — Other Ambulatory Visit: Payer: Medicare Other

## 2015-08-28 ENCOUNTER — Ambulatory Visit: Payer: Medicare Other | Admitting: Hematology & Oncology

## 2015-09-06 ENCOUNTER — Encounter: Payer: Self-pay | Admitting: Hematology & Oncology

## 2015-09-06 ENCOUNTER — Other Ambulatory Visit (HOSPITAL_BASED_OUTPATIENT_CLINIC_OR_DEPARTMENT_OTHER): Payer: Medicare Other

## 2015-09-06 ENCOUNTER — Ambulatory Visit (HOSPITAL_BASED_OUTPATIENT_CLINIC_OR_DEPARTMENT_OTHER): Payer: Medicare Other | Admitting: Hematology & Oncology

## 2015-09-06 VITALS — BP 119/62 | HR 74 | Temp 97.6°F | Wt 241.0 lb

## 2015-09-06 DIAGNOSIS — R161 Splenomegaly, not elsewhere classified: Secondary | ICD-10-CM

## 2015-09-06 DIAGNOSIS — R109 Unspecified abdominal pain: Secondary | ICD-10-CM

## 2015-09-06 DIAGNOSIS — D696 Thrombocytopenia, unspecified: Secondary | ICD-10-CM | POA: Diagnosis not present

## 2015-09-06 DIAGNOSIS — R590 Localized enlarged lymph nodes: Secondary | ICD-10-CM

## 2015-09-06 LAB — CBC WITH DIFFERENTIAL (CANCER CENTER ONLY)
BASO#: 0 10*3/uL (ref 0.0–0.2)
BASO%: 0.2 % (ref 0.0–2.0)
EOS%: 1.7 % (ref 0.0–7.0)
Eosinophils Absolute: 0.1 10*3/uL (ref 0.0–0.5)
HEMATOCRIT: 35.9 % — AB (ref 38.7–49.9)
HGB: 12.7 g/dL — ABNORMAL LOW (ref 13.0–17.1)
LYMPH#: 1.6 10*3/uL (ref 0.9–3.3)
LYMPH%: 38.2 % (ref 14.0–48.0)
MCH: 33.9 pg — ABNORMAL HIGH (ref 28.0–33.4)
MCHC: 35.4 g/dL (ref 32.0–35.9)
MCV: 96 fL (ref 82–98)
MONO#: 0.2 10*3/uL (ref 0.1–0.9)
MONO%: 5.4 % (ref 0.0–13.0)
NEUT%: 54.5 % (ref 40.0–80.0)
NEUTROS ABS: 2.2 10*3/uL (ref 1.5–6.5)
PLATELETS: 97 10*3/uL — AB (ref 145–400)
RBC: 3.75 10*6/uL — AB (ref 4.20–5.70)
RDW: 14.4 % (ref 11.1–15.7)
WBC: 4.1 10*3/uL (ref 4.0–10.0)

## 2015-09-07 NOTE — Progress Notes (Signed)
Hematology and Oncology Follow Up Visit  Fernando Soto [id number] 12/15/1945 69 y.o. 09/07/2015   Principle Diagnosis:   Thrombocytopenia  Splenomegaly  IgG kappa MGUS  Current Therapy:    Observation     Interim History:  Fernando Soto is back for follow-up. He is still having a lot of pain and left upper quadrant. I can't imagine this not being anything but his spleen. He has some moderate splenomegaly.  He was seeing his surgeon previously and his surgeon was somewhat reluctant in wanting to remove the spleen.  He has had no fever. His blood sugars have been on the high side. He is not sleeping all that well. He cannot sleep on his left side because of the pain.  He's had no problems with bowels or bladder. He's had no leading. He's had no leg swelling.  He does have some slight chronic swelling of the left leg. 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   Back in July, his labs showed he had a 0.1 g/dL M spike. This is an IgG Kappa protein. His immunoglobulin levels have been normal. His light chain levels have been okay.  He's had no problems with fever. He's had no rashes.  He just has very little quality-of-life runout from what he says. He is just tired of hurting. He really would like to get this spleen taken out.  I suspect this might be from his spleen.   Medications:  Current outpatient prescriptions:  .  citalopram (CELEXA) 40 MG tablet, Take 40 mg by mouth., Disp: , Rfl:  .  lidocaine (LIDODERM) 5 %, Place 1 patch onto the skin as needed., Disp: , Rfl:  .  oxyCODONE-acetaminophen (PERCOCET) 10-325 MG tablet, Take 10 tablets by mouth as needed., Disp: , Rfl:  .  tiZANidine (ZANAFLEX) 4 MG capsule, Take 4 mg by mouth as needed., Disp: , Rfl:  .  allopurinol (ZYLOPRIM) 300 MG tablet, Take 300 mg by mouth daily. Frequency:   Dosage:0.0     Instructions:  Note:, Disp: , Rfl:  .  Cholecalciferol (D 2000) 2000 UNITS TABS, Take by  mouth every morning. , Disp: , Rfl:  .  doxepin (SINEQUAN) 50 MG capsule, Take 50 mg by mouth at bedtime as needed. Frequency:   Dosage:0.0     Instructions:  Note:, Disp: , Rfl:  .  enalapril (VASOTEC) 10 MG tablet, Take 10 mg by mouth 2 (two) times daily. Frequency:   Dosage:0.0     Instructions:  Note:, Disp: , Rfl:  .  FOLBIC 2.5-25-2 MG TABS, Take 1 tablet by mouth daily., Disp: , Rfl: 99 .  Folic Acid-Vit Y8-FOY D74 (FOLBEE) 2.5-25-1 MG TABS tablet, Take 1 tablet by mouth daily. Frequency:   Dosage:0.0     Instructions:  Note:, Disp: , Rfl:  .  gabapentin (NEURONTIN) 300 MG capsule, Take 300 mg by mouth 3 (three) times daily. , Disp: , Rfl:  .  gemfibrozil (LOPID) 600 MG tablet, Take 600 mg by mouth 2 (two) times daily before a meal. , Disp: , Rfl:  .  HYDROcodone-acetaminophen (NORCO) 10-325 MG per tablet, Take 1 tablet by mouth every 6 (six) hours as needed. , Disp: , Rfl:  .  insulin detemir (LEVEMIR) 100 UNIT/ML injection, Inject 35 Units into the skin at bedtime. Inject unit subcutaneous as directed, Disp: , Rfl:  .  insulin NPH-regular Human (HUMULIN 70/30) (70-30) 100 UNIT/ML injection, Inject 25 Units into the skin daily with breakfast.  Frequency:   Dosage:0.0     Instructions:  Note:, Disp: , Rfl:  .  L-Methylfolate-B6-B12 (FOLTX) 1.13-25-2 MG TABS, Take by mouth every morning. , Disp: , Rfl:  .  metoprolol tartrate (LOPRESSOR) 25 MG tablet, Take 12.5 mg by mouth 2 (two) times daily. , Disp: , Rfl:  .  Multiple Vitamin (MULTIVITAMIN) capsule, Take by mouth daily. , Disp: , Rfl:  .  Omega-3 1000 MG CAPS, Take by mouth every morning. , Disp: , Rfl:  .  omeprazole (PRILOSEC) 40 MG capsule, Frequency:   Dosage:0.0     Instructions:  Note:, Disp: , Rfl:  .  rivaroxaban (XARELTO) 20 MG TABS tablet, Take by mouth daily with supper. PT STATES HE TAKES .5 MG DAILY, Disp: , Rfl:  .  temazepam (RESTORIL) 30 MG capsule, Take 30 mg by mouth at bedtime as needed. Frequency:   Dosage:0.0      Instructions:  Note:, Disp: , Rfl:  .  traMADol (ULTRAM) 50 MG tablet, Take 50 mg by mouth every 6 (six) hours as needed. for pain, Disp: , Rfl: 0  Allergies:  Allergies  Allergen Reactions  . Influenza Vaccines     BP dropped and felt very hot.    Past Medical History, Surgical history, Social history, and Family History were reviewed and updated.  Review of Systems: As above  Physical Exam:  weight is 241 lb 0.6 oz (109.335 kg). His oral temperature is 97.6 F (36.4 C). His blood pressure is 119/62 and his pulse is 74.   Wt Readings from Last 3 Encounters:  09/06/15 241 lb 0.6 oz (109.335 kg)  05/29/15 250 lb (113.399 kg)  02/24/15 249 lb (112.946 kg)     Somewhat obese white gentleman in no obvious distress. Head and neck exam shows no ocular or oral lesions. He has no palpable cervical or supraclavicular lymph nodes. Lungs are clear. Cardiac exam regular rate and rhythm with no murmurs, rubs or bruits. Abdomen is obese but soft. He has good bowel sounds. There is no fluid wave. There is no palpable liver edge. His spleen tip might be palpable at the left costal margin.. Back exam shows no tenderness over the spine, ribs or hips. Extremities shows no clubbing, cyanosis or edema. He has a foot splint on his left foot. He does have some chronic nonpitting edema of the lower legs. He has some stasis dermatitis changes in the lower legs. Neurological exam shows some hypersensitivity to touch in the feet and ankles.  Lab Results  Component Value Date   WBC 4.1 09/06/2015   HGB 12.7* 09/06/2015   HCT 35.9* 09/06/2015   MCV 96 09/06/2015   PLT 97* 09/06/2015     Chemistry      Component Value Date/Time   NA 135 09/06/2015 1412   NA 141 01/11/2015 0845   K 4.6 09/06/2015 1412   K 4.3 01/11/2015 0845   CL 102 09/06/2015 1412   CL 104 01/11/2015 0845   CO2 24 09/06/2015 1412   CO2 29 01/11/2015 0845   BUN 41* 09/06/2015 1412   BUN 20 01/11/2015 0845   CREATININE 1.35*  09/06/2015 1412   CREATININE 1.2 01/11/2015 0845      Component Value Date/Time   CALCIUM 9.4 09/06/2015 1412   CALCIUM 9.2 01/11/2015 0845   ALKPHOS 97 09/06/2015 1412   ALKPHOS 90* 01/11/2015 0845   AST 12 09/06/2015 1412   AST 25 01/11/2015 0845   ALT 4* 09/06/2015 1412   ALT 13  01/11/2015 0845   BILITOT 0.7 09/06/2015 1412   BILITOT 1.40 01/11/2015 0845         Impression and Plan: Fernando Soto is 69 year old gentleman. He has splenomegaly. He has thrombocytopenia.  He still is complaining about a lot of pain. As such, I really think that his spleen is going have to come out.  I will get another set of CT scans on him so we can see exactly what might be going on.  Her lab work so far has not shown any obvious malignancy which I am grateful for. He does have the IgG kappa MGUS which could indicate the possibility of a splenic lymphoma.  After we get his CAT scans back, then I will speak to his surgeon, Dr. Christian Mate and see if he cannot get him to do the procedure. I realize that this would not be a simple procedure given Fernando Soto's other health issues and anticoagulation.  I will plan to get him back in about 6 weeks or so at which point, hopefully he would've had surgery.  I spent about 30 minutes with him.Volanda Napoleon, MD 11/3/20165:21 PM

## 2015-09-08 ENCOUNTER — Ambulatory Visit (HOSPITAL_BASED_OUTPATIENT_CLINIC_OR_DEPARTMENT_OTHER)
Admission: RE | Admit: 2015-09-08 | Discharge: 2015-09-08 | Disposition: A | Discharge status: Home / Self Care | Payer: Medicare Other | Source: Ambulatory Visit | Attending: Hematology & Oncology | Admitting: Hematology & Oncology

## 2015-09-08 ENCOUNTER — Encounter (HOSPITAL_BASED_OUTPATIENT_CLINIC_OR_DEPARTMENT_OTHER): Payer: Self-pay

## 2015-09-08 DIAGNOSIS — R590 Localized enlarged lymph nodes: Secondary | ICD-10-CM | POA: Insufficient documentation

## 2015-09-08 DIAGNOSIS — I517 Cardiomegaly: Secondary | ICD-10-CM | POA: Insufficient documentation

## 2015-09-08 DIAGNOSIS — E119 Type 2 diabetes mellitus without complications: Secondary | ICD-10-CM | POA: Diagnosis not present

## 2015-09-08 DIAGNOSIS — M438X6 Other specified deforming dorsopathies, lumbar region: Secondary | ICD-10-CM | POA: Insufficient documentation

## 2015-09-08 DIAGNOSIS — R109 Unspecified abdominal pain: Secondary | ICD-10-CM | POA: Diagnosis not present

## 2015-09-08 DIAGNOSIS — N200 Calculus of kidney: Secondary | ICD-10-CM | POA: Diagnosis not present

## 2015-09-08 DIAGNOSIS — R161 Splenomegaly, not elsewhere classified: Secondary | ICD-10-CM | POA: Insufficient documentation

## 2015-09-08 HISTORY — DX: Essential (primary) hypertension: I10

## 2015-09-08 HISTORY — DX: Type 2 diabetes mellitus without complications: E11.9

## 2015-09-08 LAB — COMPREHENSIVE METABOLIC PANEL
ALT: 4 U/L — AB (ref 9–46)
AST: 12 U/L (ref 10–35)
Albumin: 4.5 g/dL (ref 3.6–5.1)
Alkaline Phosphatase: 97 U/L (ref 40–115)
BILIRUBIN TOTAL: 0.7 mg/dL (ref 0.2–1.2)
BUN: 41 mg/dL — ABNORMAL HIGH (ref 7–25)
CALCIUM: 9.4 mg/dL (ref 8.6–10.3)
CHLORIDE: 102 mmol/L (ref 98–110)
CO2: 24 mmol/L (ref 20–31)
Creatinine, Ser: 1.35 mg/dL — ABNORMAL HIGH (ref 0.70–1.18)
GLUCOSE: 268 mg/dL — AB (ref 65–99)
Potassium: 4.6 mmol/L (ref 3.5–5.3)
Sodium: 135 mmol/L (ref 135–146)
TOTAL PROTEIN: 6.5 g/dL (ref 6.1–8.1)

## 2015-09-08 LAB — SPEP & IFE WITH QIG
ALBUMIN ELP: 4.4 g/dL (ref 3.8–4.8)
Abnormal Protein Band1: 0.2 g/dL
Alpha-1-Globulin: 0.3 g/dL (ref 0.2–0.3)
Alpha-2-Globulin: 0.7 g/dL (ref 0.5–0.9)
BETA 2: 0.3 g/dL (ref 0.2–0.5)
BETA GLOBULIN: 0.4 g/dL (ref 0.4–0.6)
Gamma Globulin: 0.5 g/dL — ABNORMAL LOW (ref 0.8–1.7)
IGA: 154 mg/dL (ref 68–379)
IGM, SERUM: 27 mg/dL — AB (ref 41–251)
IgG (Immunoglobin G), Serum: 600 mg/dL — ABNORMAL LOW (ref 650–1600)
Total Protein, Serum Electrophoresis: 6.5 g/dL (ref 6.1–8.1)

## 2015-09-08 LAB — KAPPA/LAMBDA LIGHT CHAINS
KAPPA LAMBDA RATIO: 1.72 — AB (ref 0.26–1.65)
Kappa free light chain: 2.71 mg/dL — ABNORMAL HIGH (ref 0.33–1.94)
Lambda Free Lght Chn: 1.58 mg/dL (ref 0.57–2.63)

## 2015-09-08 LAB — LACTATE DEHYDROGENASE: LDH: 174 U/L (ref 94–250)

## 2015-09-08 MED ORDER — IOHEXOL 300 MG/ML  SOLN
100.0000 mL | Freq: Once | INTRAMUSCULAR | Status: AC | PRN
  Administered 2015-09-08: 100 mL via INTRAVENOUS

## 2016-05-26 IMAGING — US US ABDOMEN LIMITED
1 series · 14 of 15 positions shown · non-contrast
Comparison: CT abdomen pelvis dated 02/17/2015. Limited abdominal
ultrasound dated 01/11/2015.

CLINICAL DATA: Follow-up splenomegaly, left upper quadrant pain

EXAM:
LIMITED ABDOMINAL ULTRASOUND

[Series 1: us abdomen limited · 0.24mm/px · 14 of 15 slices shown]
[im 1/15]
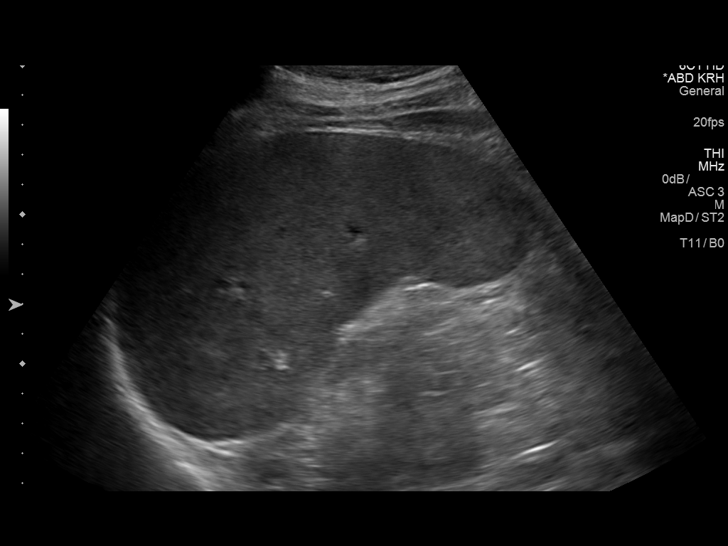
[im 2/15]
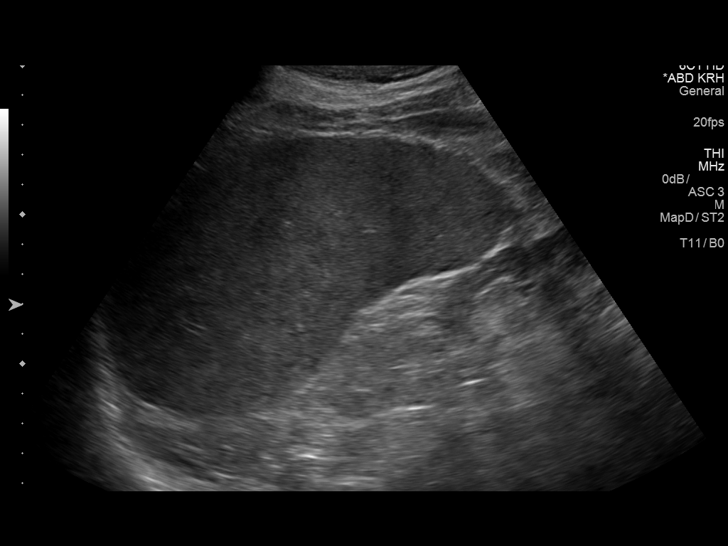
[im 3/15]
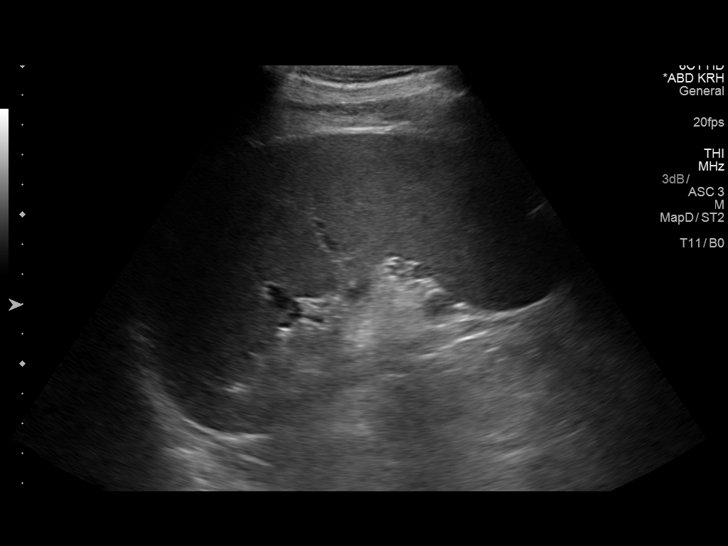
[im 4/15]
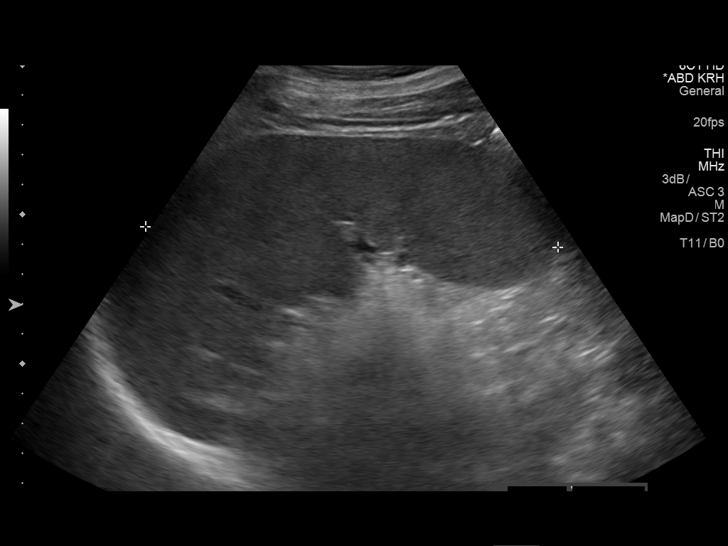
[im 5/15]
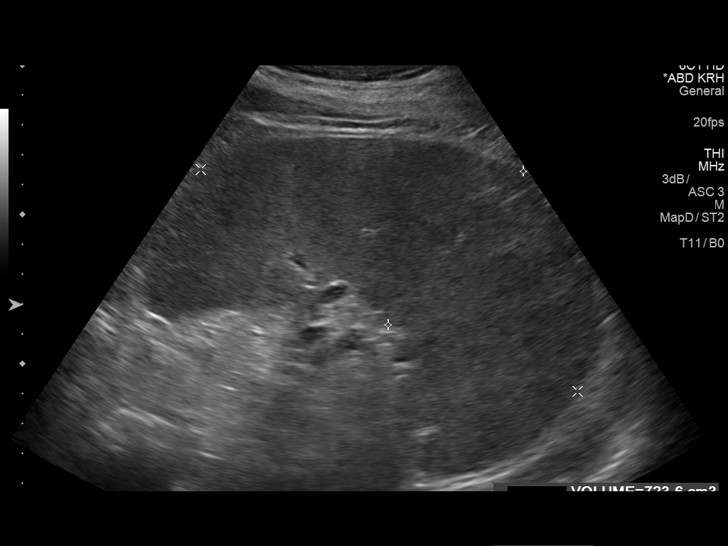
[im 6/15]
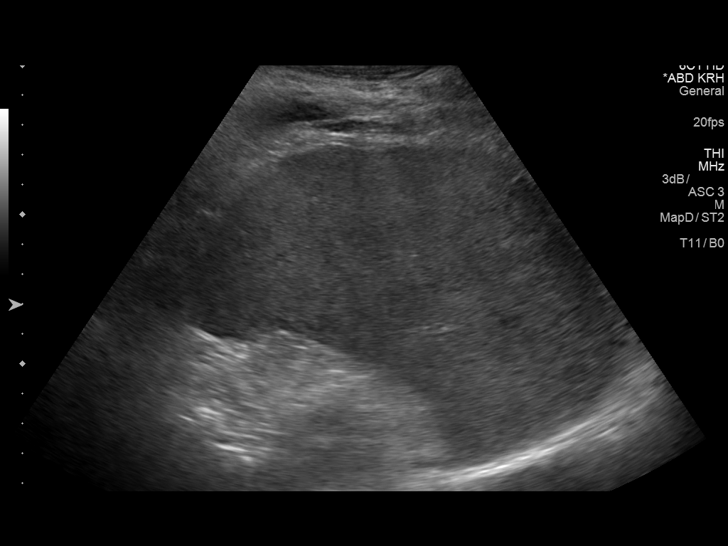
[im 7/15]
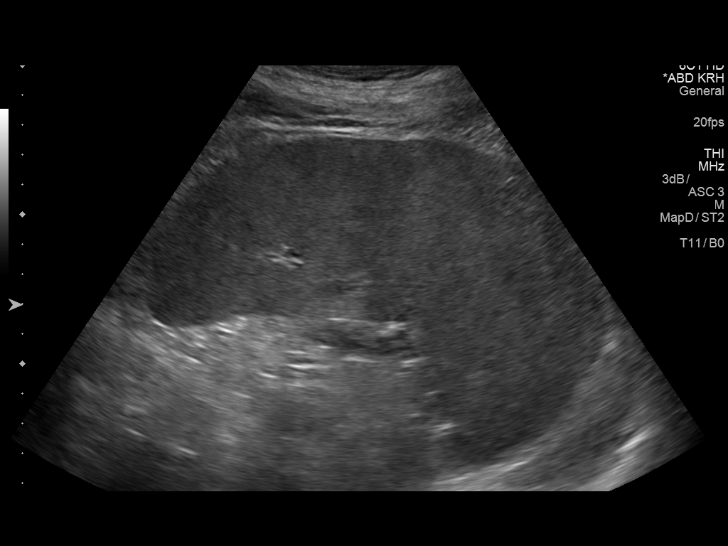
[im 9/15]
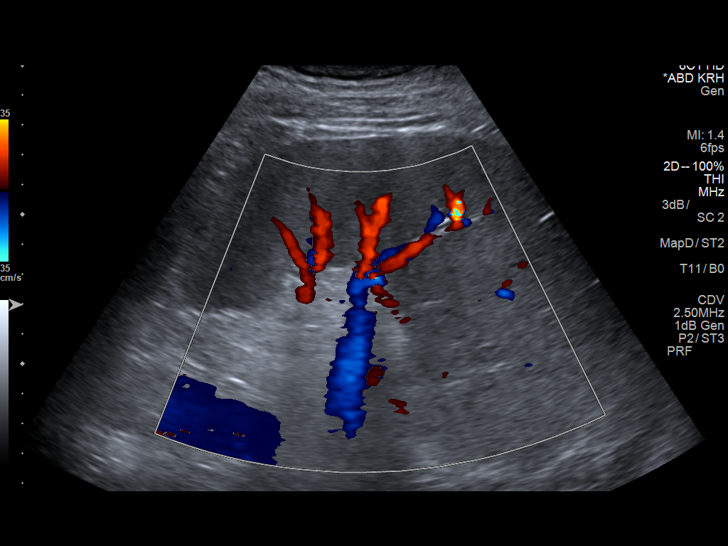
[im 10/15]
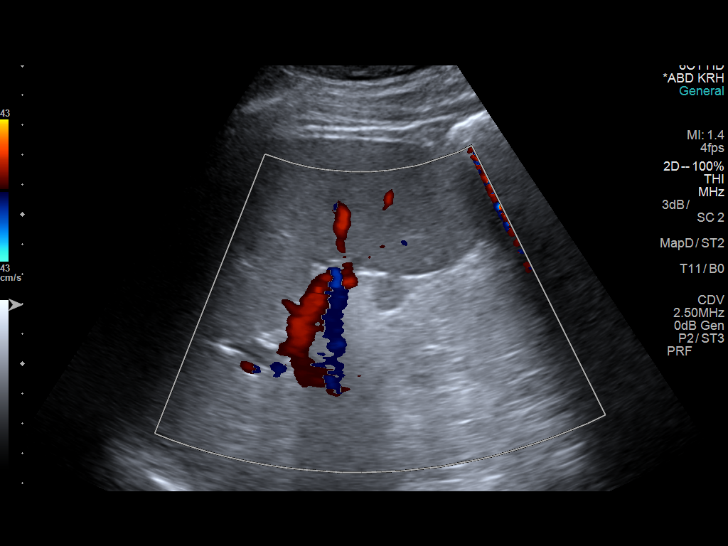
[im 11/15]
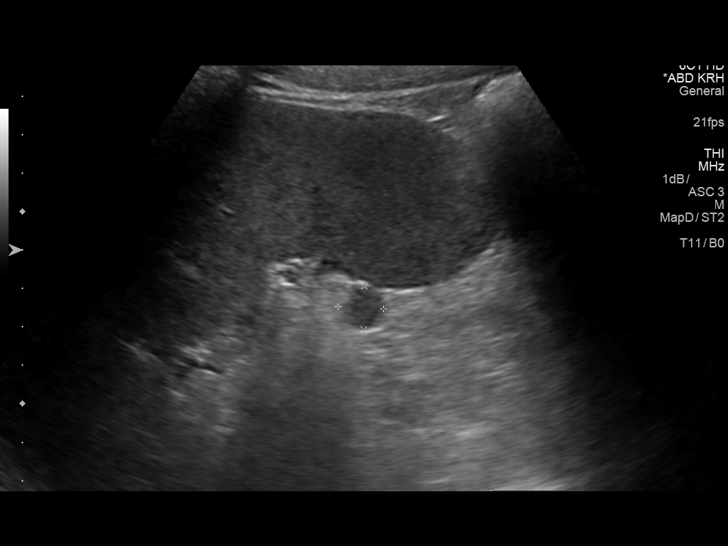
[im 12/15]
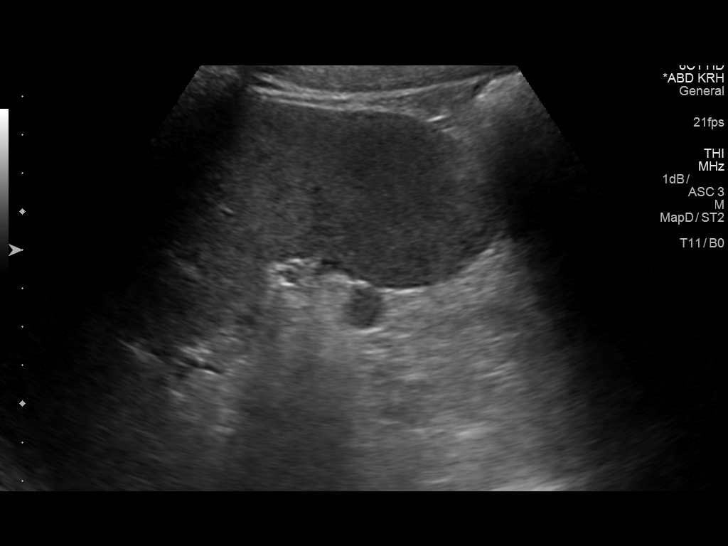
[im 13/15]
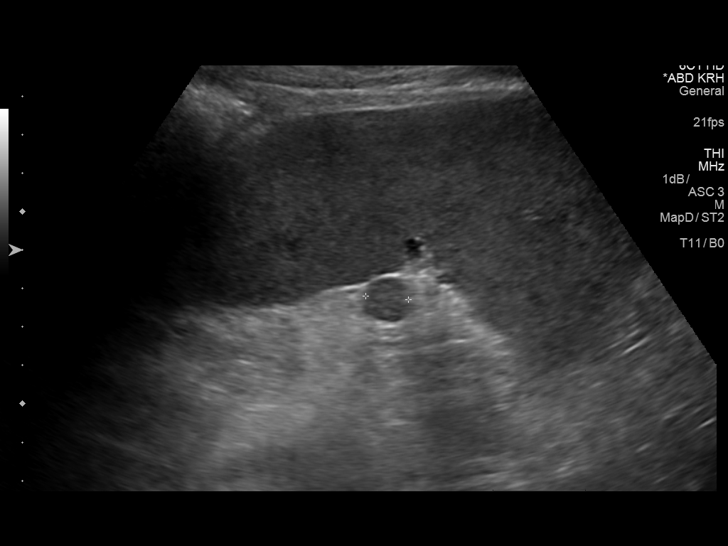
[im 14/15]
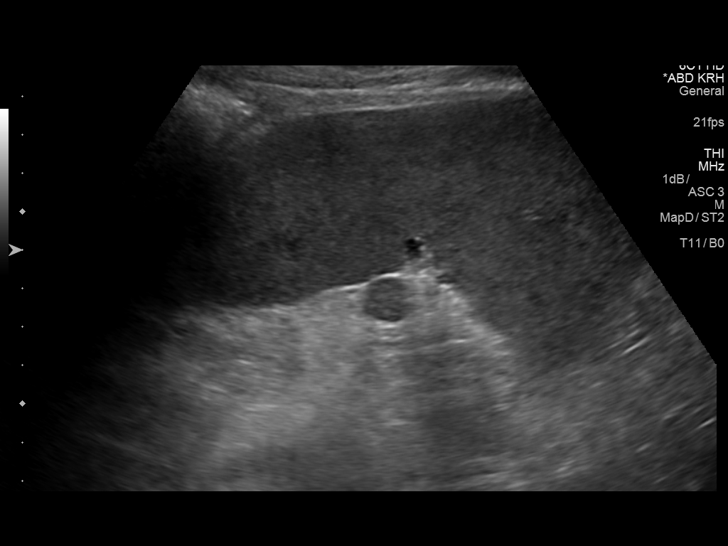
[im 15/15]
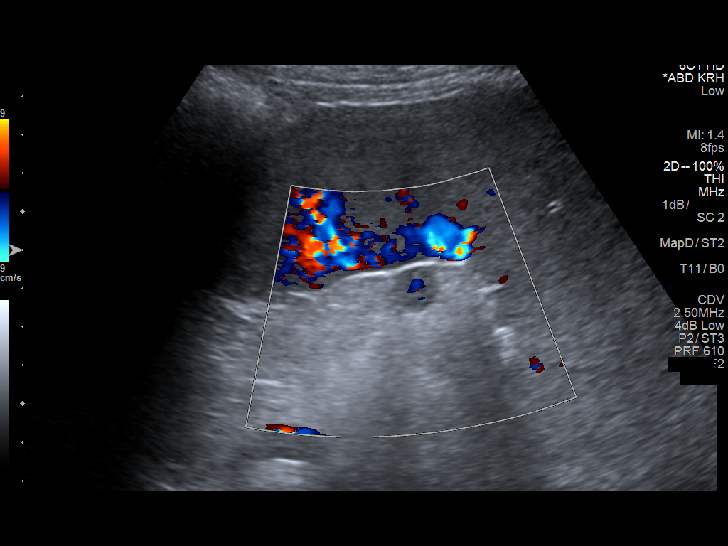

[14 of 15 positions shown; findings below may reference images not displayed]

FINDINGS: Mild to moderate splenomegaly, measuring 15.4 x 14.6 x 6.8 cm
(calculated volume 806 mL), stable versus mildly decreased.

12 mm splenule in the splenic hilum.

No splenic mass is visualized.
IMPRESSION: Mild to moderate splenomegaly, measuring 15.4 cm in length
(calculated volume 806 mL), stable versus mildly decreased.

## 2016-12-31 IMAGING — US US ABDOMEN LIMITED
1 series · 14 of 16 positions shown · non-contrast
Comparison: CT on 10/18/2014

CLINICAL DATA: Splenomegaly.

EXAM:
LIMITED ABDOMINAL ULTRASOUND

[Series 1: us abdomen limited · 0.24mm/px · 14 of 16 slices shown]
[im 1/16]
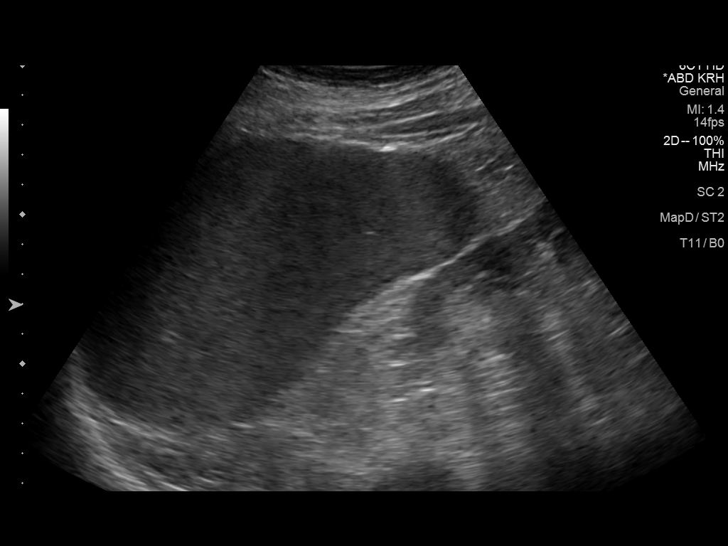
[im 2/16]
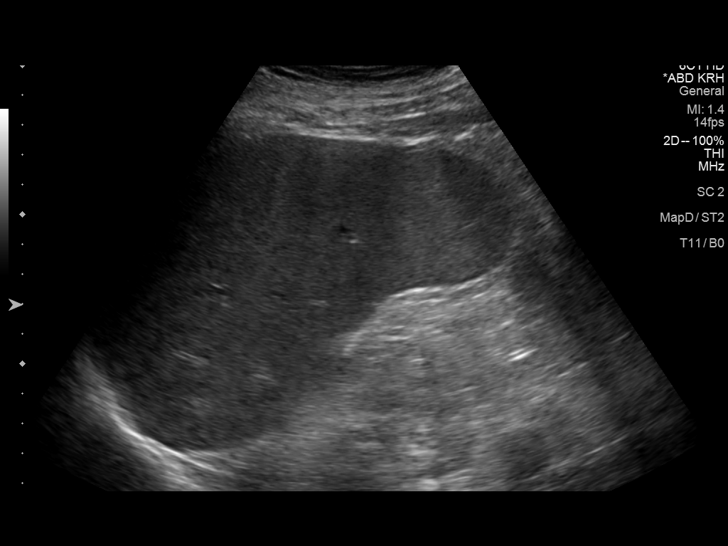
[im 3/16]
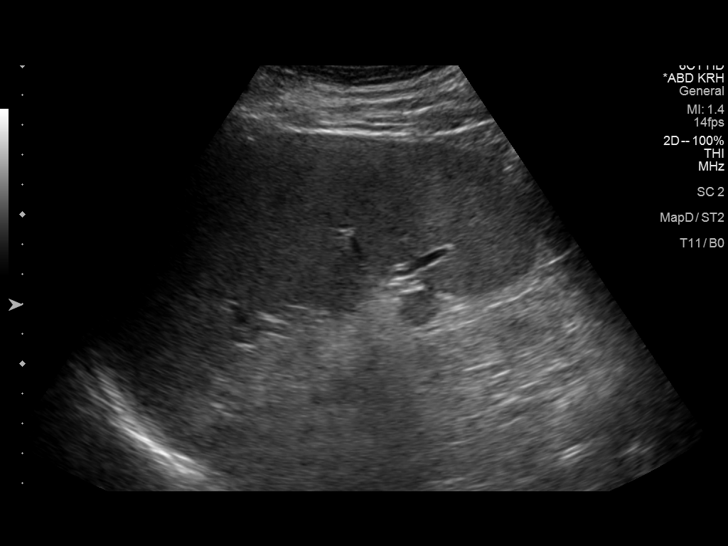
[im 5/16]
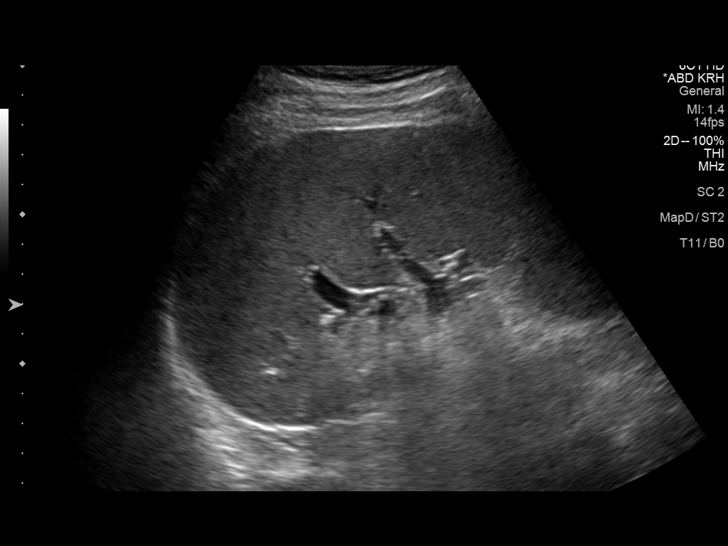
[im 6/16]
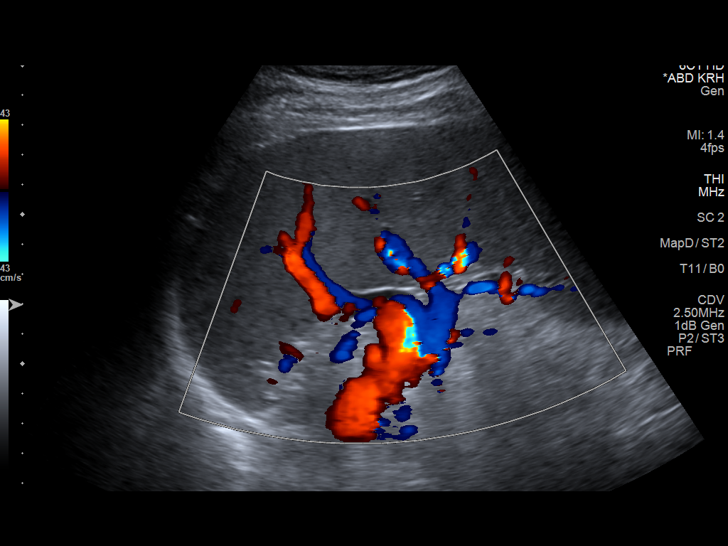
[im 7/16]
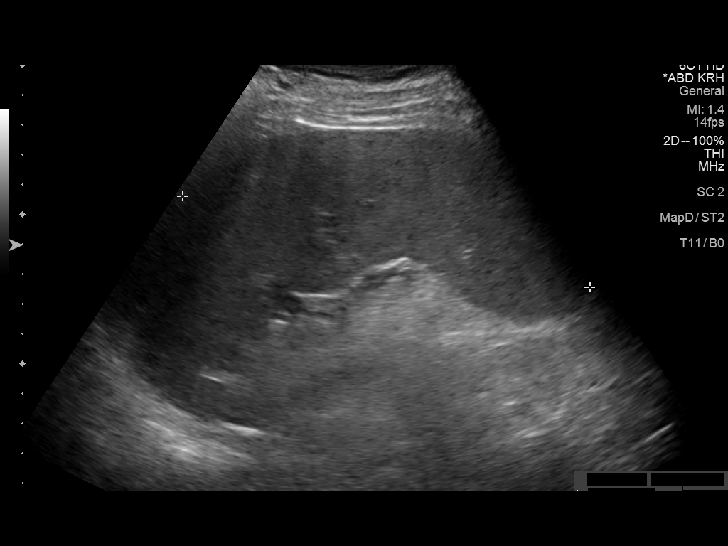
[im 8/16]
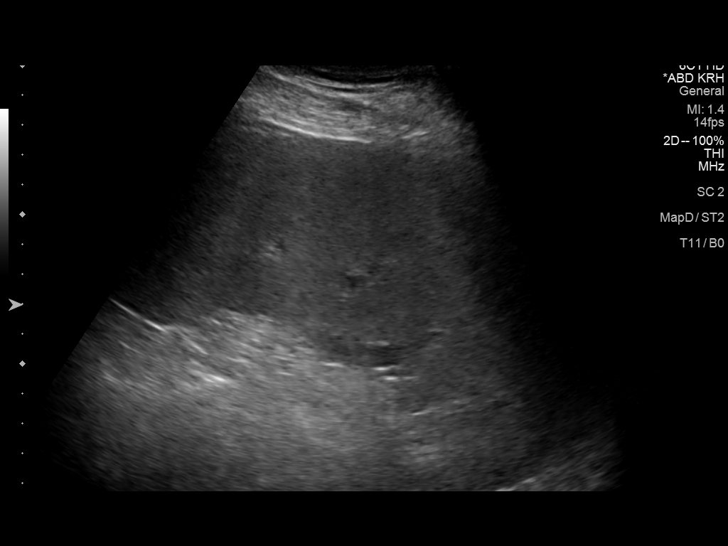
[im 9/16]
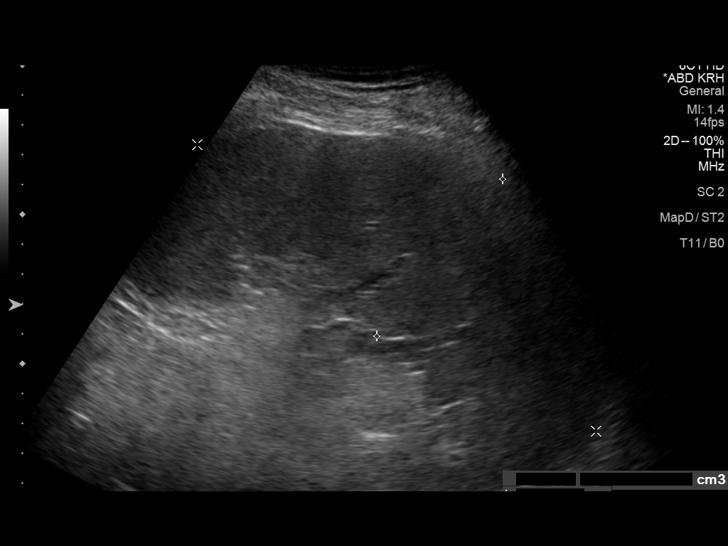
[im 10/16]
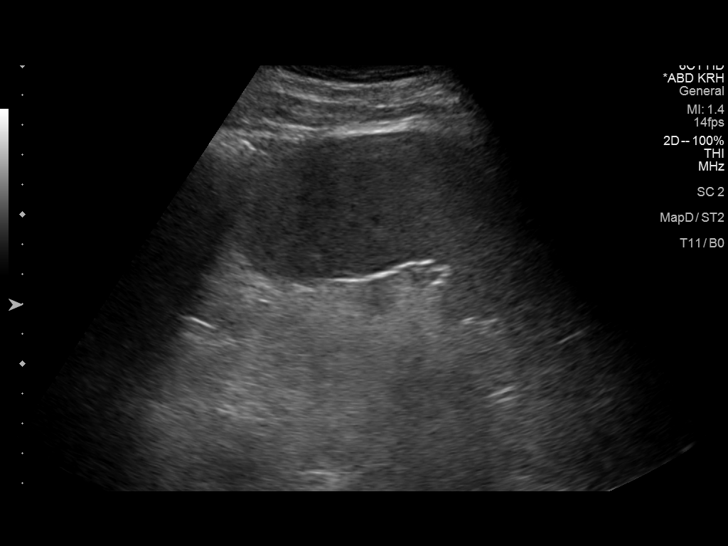
[im 11/16]
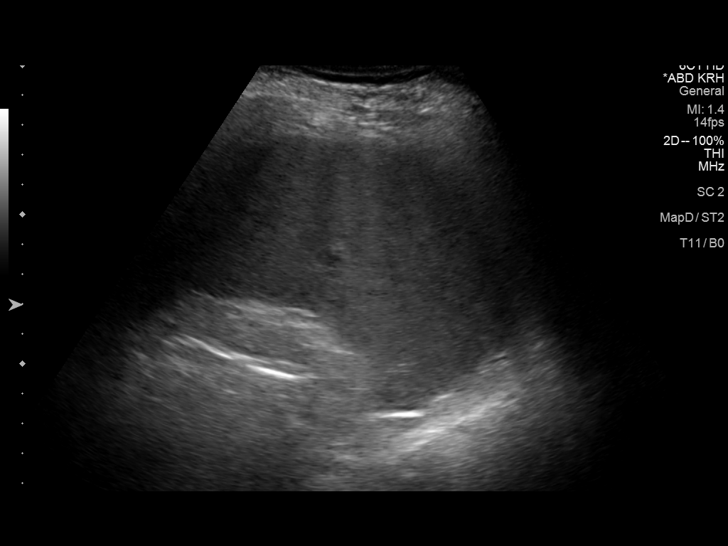
[im 13/16]
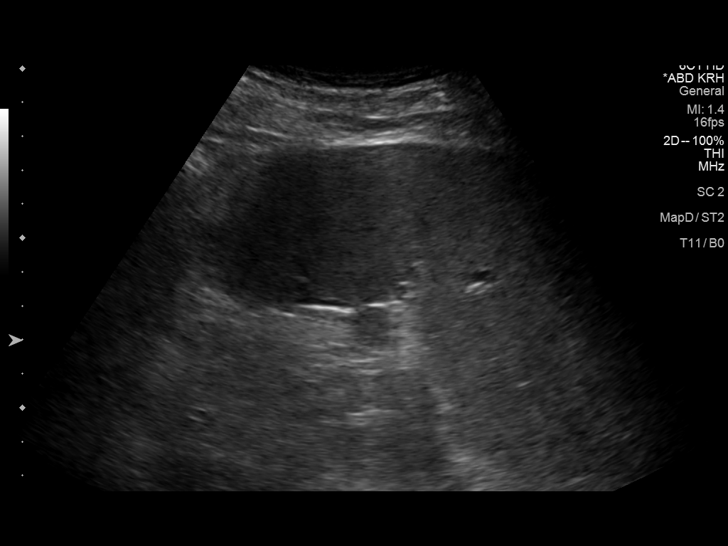
[im 14/16]
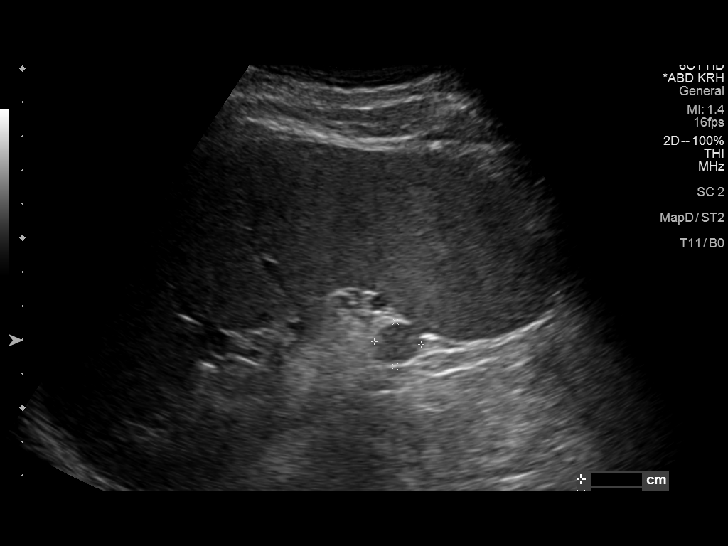
[im 15/16]
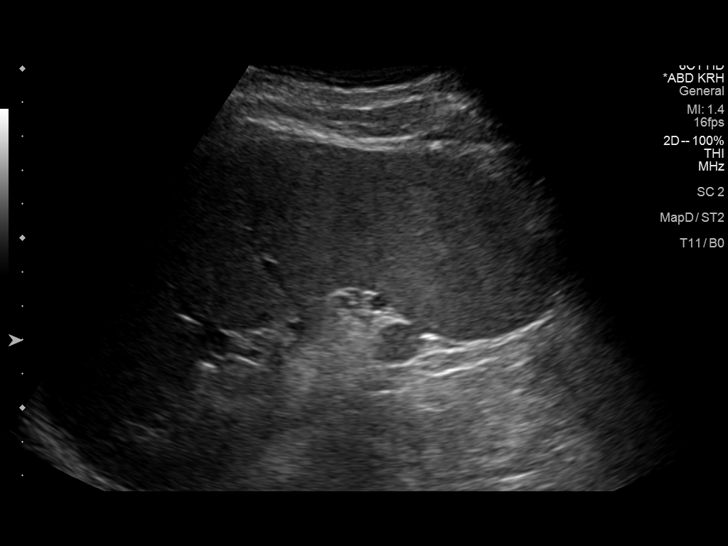
[im 16/16]
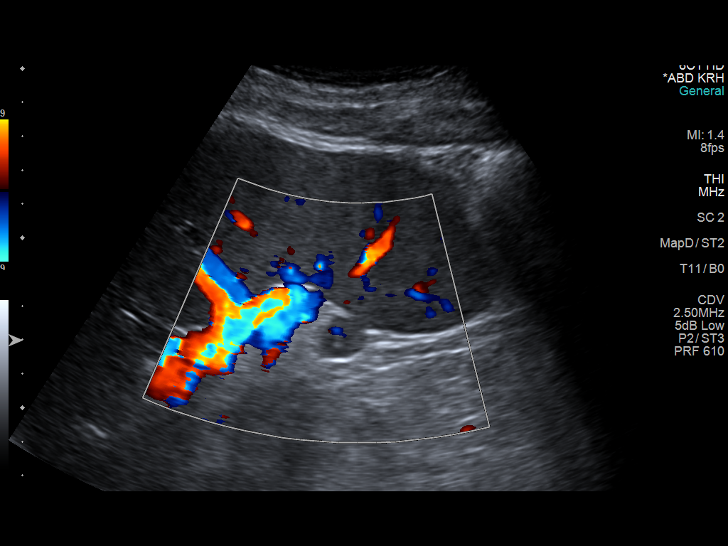

[14 of 16 positions shown; findings below may reference images not displayed]

FINDINGS: Limited ultrasound examination was performed to evaluate the spleen.
Splenomegaly is again demonstrated. The spleen measures
approximately 15.1 cm length with estimated volume of 868 mL. This
is similar to previous CT. No splenic masses are identified.
IMPRESSION: Mild to moderate splenomegaly.  No splenic masses visualized.

## 2017-08-28 IMAGING — CT CT ABD-PELV W/ CM
3 of 5 series · 7 of 46 positions shown, 13 images · IV contrast (APPLIED)
Comparison: 07/01/2014 chest CT. Abdominal pelvic CT 02/17/2015.
Abdominal ultrasound 05/29/2015.

CLINICAL DATA: Splenomegaly. Axillary adenopathy. Left abdominal
pain laterally. Diabetes.

EXAM:
CT CHEST, ABDOMEN, AND PELVIS WITH CONTRAST
TECHNIQUE: Multidetector CT imaging of the chest, abdomen and pelvis was
performed following the standard protocol during bolus
administration of intravenous contrast.
CONTRAST:  100mL OMNIPAQUE IOHEXOL 300 MG/ML  SOLN

[Series 5: chest/abd/pel 3.0 coronal · coronal · 1.06mm/px · 3 of 102 slices shown, 4 images]
[im 34/102  soft-tissue]
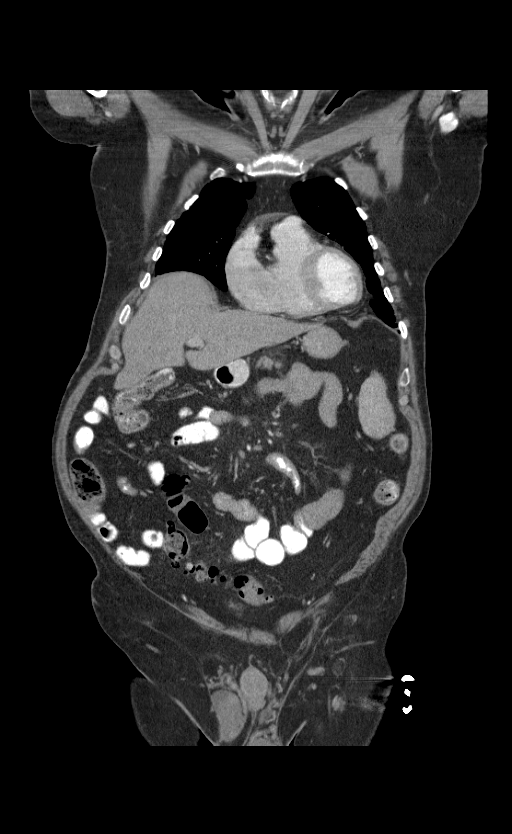
[im 45/102  soft-tissue]
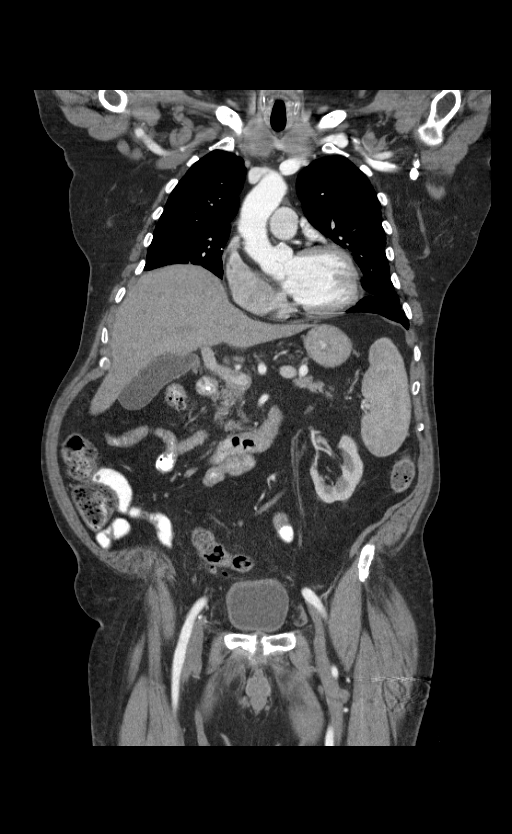
[im 45/102  bone]
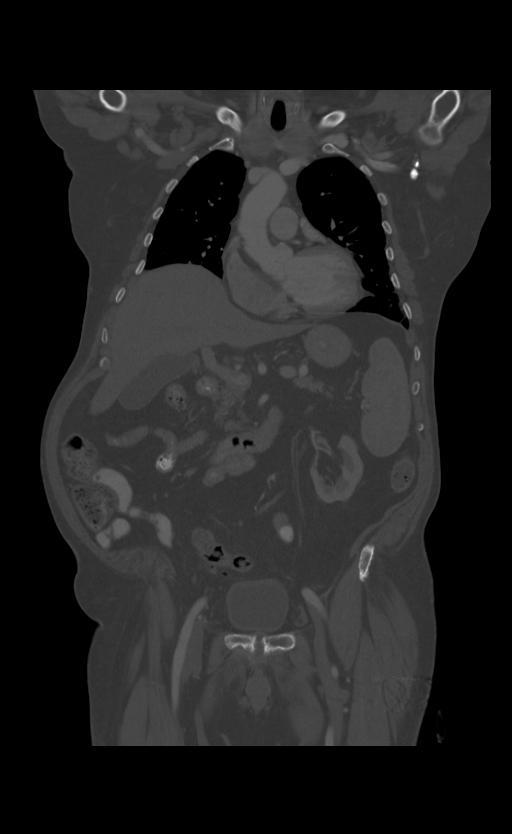
[im 57/102  soft-tissue]
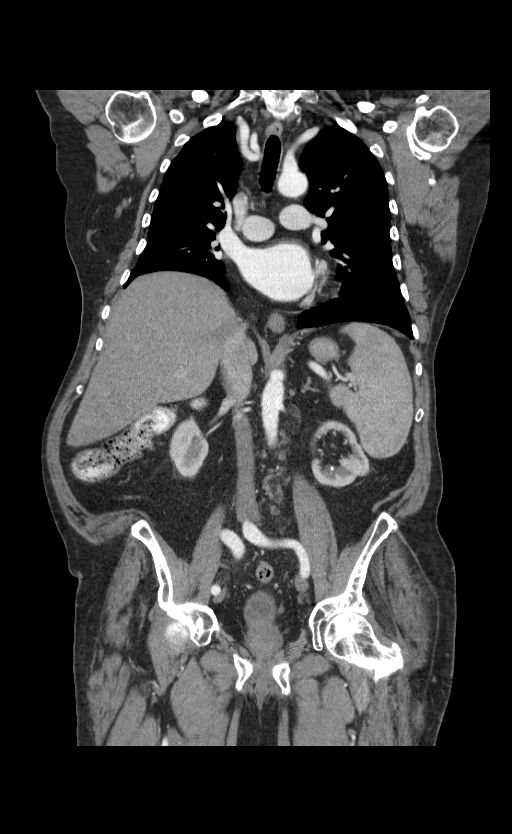

[Series 6: chest/abd/pel 3.0 sagittal · sagittal · 0.77mm/px · 1 of 137 slices shown, 2 images]
[im 46/137  soft-tissue]
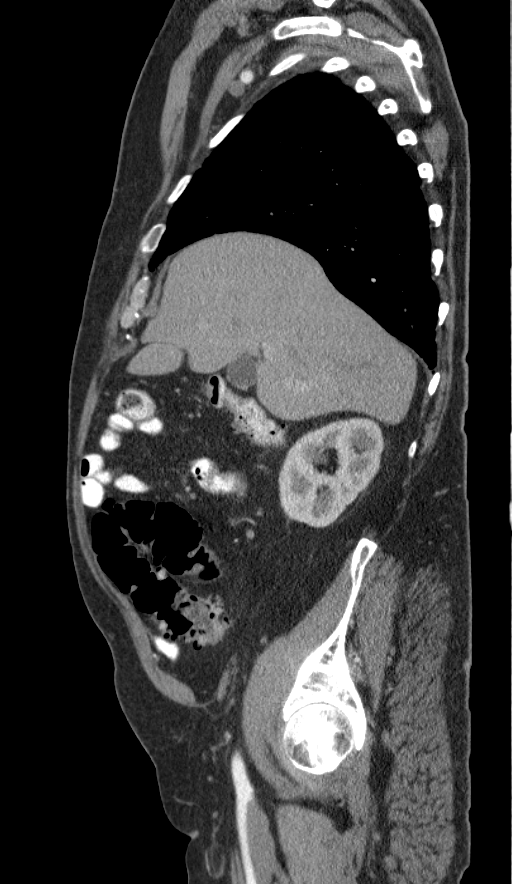
[im 46/137  bone]
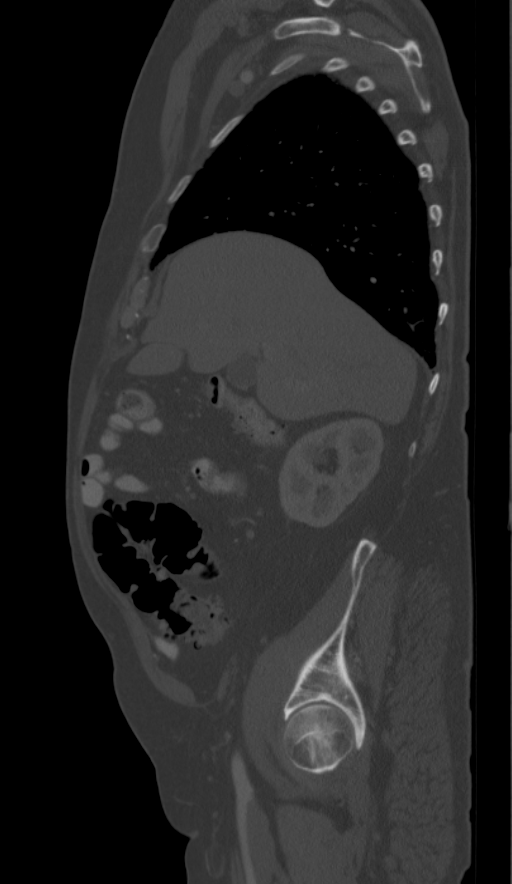

[Series 7: renal delay 5.0 b30f · axial · delayed · 0.68mm/px · z∈[-392,-312]mm · 3 of 34 slices shown, 7 images]
[im 9/34  soft-tissue]
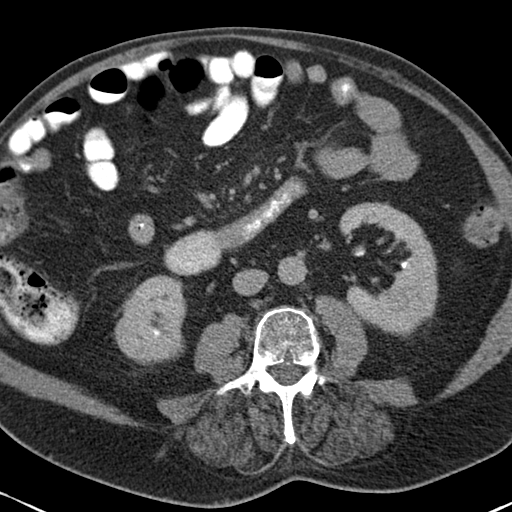
[im 9/34  lung]
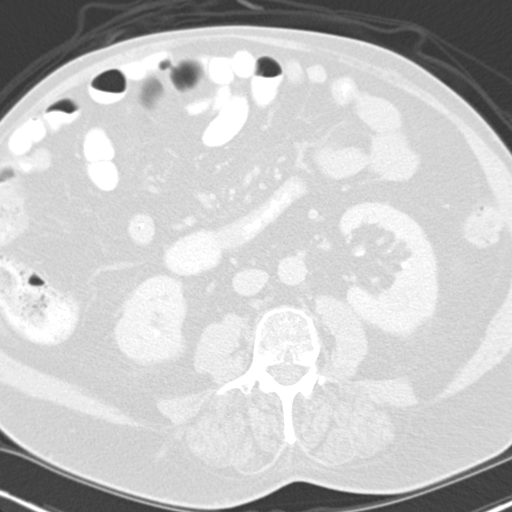
[im 9/34  bone]
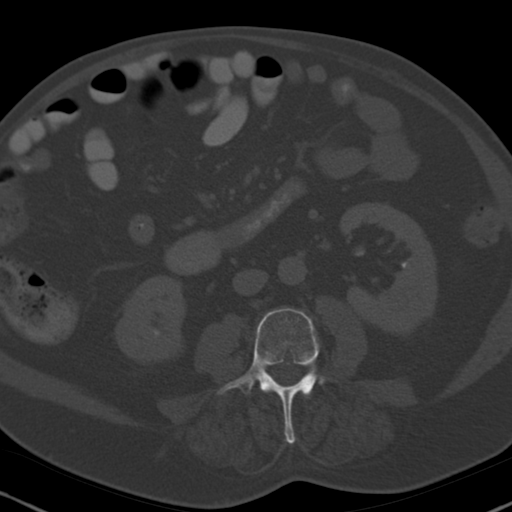
[im 17/34  soft-tissue]
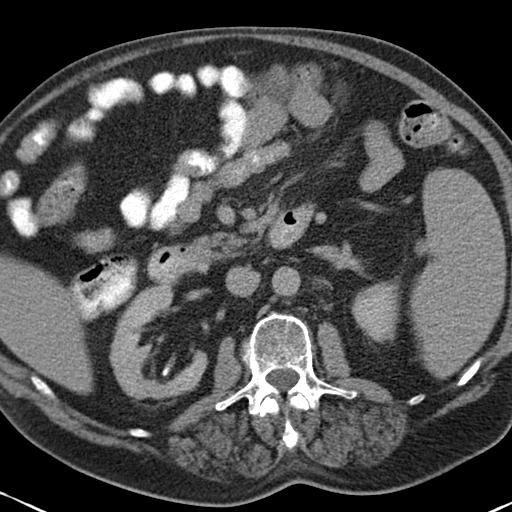
[im 17/34  lung]
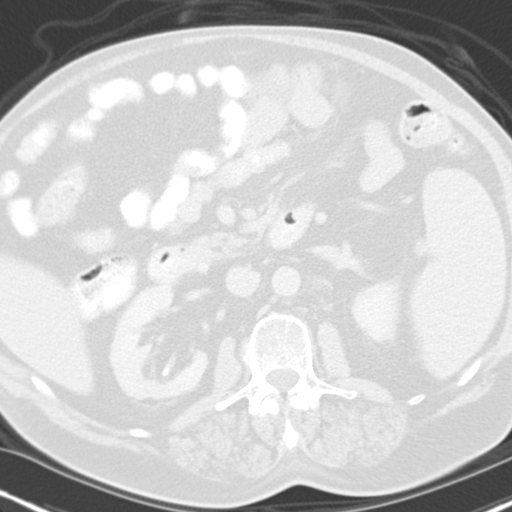
[im 25/34  soft-tissue]
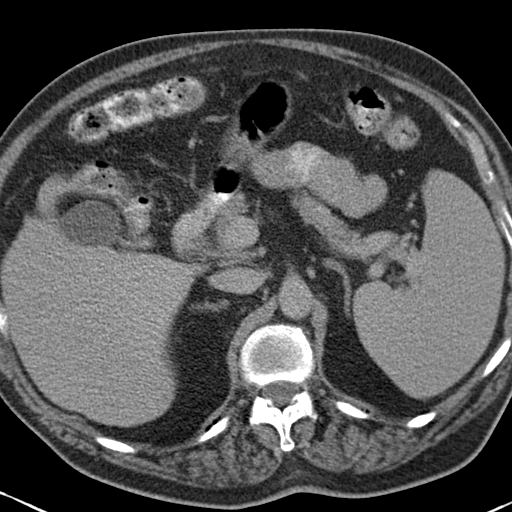
[im 25/34  lung]
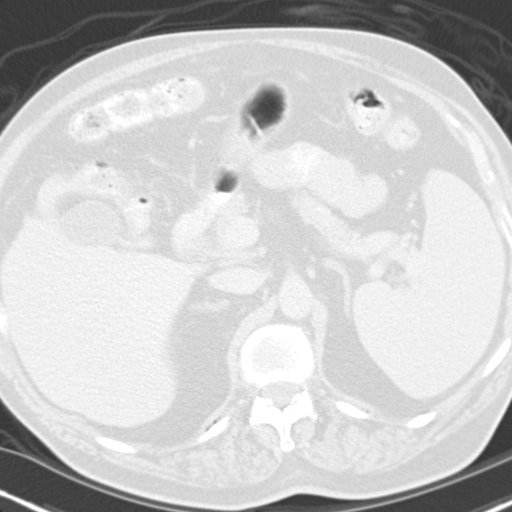

[7 of 46 positions shown; findings below may reference images not displayed]

FINDINGS: CT CHEST FINDINGS

Mediastinum/Nodes: No supraclavicular adenopathy. Bilateral axillary
nodes which are fatty replaced. Example left axillary node (image
27, series 2). Unchanged. Difficult to measure secondary to extent
of fatty replacement. No suspicious nodes are identified. Bovine
arch. Mild cardiomegaly with lipomatous hypertrophy of the
interatrial septum. No pericardial effusion. No central pulmonary
embolism, on this non-dedicated study. No mediastinal or hilar
adenopathy.

Lungs/Pleura: Mild medial pleural thickening bilaterally. No pleural
fluid.

Right apical scarring. The nodular component measured on the prior
exam is less apparent today.

Mild subpleural interstitial prominence, most apparent at the bases.
This is decreased.

Musculoskeletal: Remote right rib trauma.

CT ABDOMEN PELVIS FINDINGS

Hepatobiliary: Similar appearance of scattered too small to
characterize liver lesions. High left hepatic lobe cysts. Normal
gallbladder, without biliary ductal dilatation. Periampullary
duodenal diverticulum incidentally noted.

Pancreas: Normal, without mass or ductal dilatation.

Spleen: Splenomegaly which is similar at 13.9 cm craniocaudal.
Maximal transverse dimension of 16.3 cm.

Adrenals/Urinary Tract: An exophytic lesion off the medial right
adrenal gland measures 1.1 cm and has fat density within, most
consistent with a myelolipoma. This is similar in size to on the
prior. The left adrenal is mildly thickened. Bilateral
nephrolithiasis. Mild renal cortical thinning bilaterally. Normal
ureters and urinary bladder.

Stomach/Bowel: Proximal gastric underdistention. Scattered colonic
diverticula. Normal terminal ileum and appendix. Normal small bowel.

Vascular/Lymphatic: Normal caliber of the aorta and branch vessels.
Multiple small retroperitoneal nodes are again identified. None are
pathologic by size criteria.

Prominent fatty replaced pelvic nodes are also identified. These are
also difficult to measure, secondary to fatty replacement. Example
right external iliac node at 1.5 cm (image 112, series 2). Similar.

Reproductive: Normal prostate.

Other: No significant free fluid. Tiny fat containing left inguinal
hernia.

Musculoskeletal: Mild osteopenia. Mild compression deformity at L1,
without significant ventral canal encroachment. This is new since
02/17/2015.
IMPRESSION: CT CHEST IMPRESSION

1. Similar appearance of prominent axillary nodes which are fatty
replaced, most consistent with a benign etiology. These could relate
to prior infection/inflammation.
2. Mild cardiomegaly.
3. Suspect mild interstitial lung disease, as evidenced by minimal
basilar predominant subpleural interstitial prominence. Possibly
related to nonspecific interstitial pneumonia.

CT ABDOMEN AND PELVIS IMPRESSION

1. Similar splenomegaly.
2. Right adrenal lesion is favored to represent a myelolipoma and is
not significantly changed.
3. Bilateral nephrolithiasis.
4. Prominent pelvic nodes, also fatty replaced and likely post
infectious or inflammatory.
5. New mild L1 compression deformity.

## 2020-05-04 DEATH — deceased
